# Patient Record
Sex: Female | Born: 1980 | Race: Black or African American | Hispanic: No | Marital: Single | Smoking: Never smoker
Health system: Southern US, Community
[De-identification: ages and names within clinical notes are randomized; demographics above are authoritative.]

## PROBLEM LIST (undated history)

## (undated) DIAGNOSIS — J310 Chronic rhinitis: Secondary | ICD-10-CM

## (undated) DIAGNOSIS — R7303 Prediabetes: Secondary | ICD-10-CM

## (undated) HISTORY — PX: TUBAL LIGATION: SHX77

## (undated) HISTORY — DX: Chronic rhinitis: J31.0

---

## 2018-08-05 ENCOUNTER — Other Ambulatory Visit: Payer: Medicaid Other

## 2018-08-05 ENCOUNTER — Observation Stay
Admission: EM | Admit: 2018-08-05 | Discharge: 2018-08-06 | Disposition: A | Discharge status: Home / Self Care | Payer: Medicaid Other | Attending: Internal Medicine | Admitting: Internal Medicine

## 2018-08-05 ENCOUNTER — Other Ambulatory Visit: Payer: Self-pay

## 2018-08-05 ENCOUNTER — Emergency Department: Payer: Medicaid Other

## 2018-08-05 ENCOUNTER — Observation Stay: Admit: 2018-08-05 | Payer: Medicaid Other

## 2018-08-05 DIAGNOSIS — R05 Cough: Secondary | ICD-10-CM | POA: Diagnosis present

## 2018-08-05 DIAGNOSIS — J9601 Acute respiratory failure with hypoxia: Principal | ICD-10-CM | POA: Insufficient documentation

## 2018-08-05 DIAGNOSIS — Z20828 Contact with and (suspected) exposure to other viral communicable diseases: Secondary | ICD-10-CM | POA: Diagnosis not present

## 2018-08-05 DIAGNOSIS — R0902 Hypoxemia: Secondary | ICD-10-CM | POA: Diagnosis present

## 2018-08-05 DIAGNOSIS — Z6841 Body Mass Index (BMI) 40.0 and over, adult: Secondary | ICD-10-CM | POA: Insufficient documentation

## 2018-08-05 DIAGNOSIS — R059 Cough, unspecified: Secondary | ICD-10-CM

## 2018-08-05 LAB — COMPREHENSIVE METABOLIC PANEL
ALT: 18 U/L (ref 0–44)
AST: 20 U/L (ref 15–41)
Albumin: 4 g/dL (ref 3.5–5.0)
Alkaline Phosphatase: 44 U/L (ref 38–126)
Anion gap: 9 (ref 5–15)
BUN: 11 mg/dL (ref 6–20)
CO2: 23 mmol/L (ref 22–32)
Calcium: 8.7 mg/dL — ABNORMAL LOW (ref 8.9–10.3)
Chloride: 108 mmol/L (ref 98–111)
Creatinine, Ser: 0.95 mg/dL (ref 0.44–1.00)
GFR calc Af Amer: >60 mL/min (ref >60)
GFR calc non Af Amer: >60 mL/min (ref >60)
Glucose, Bld: 108 mg/dL — ABNORMAL HIGH (ref 70–99)
Potassium: 3.5 mmol/L (ref 3.5–5.1)
Sodium: 140 mmol/L (ref 135–145)
TOTAL PROTEIN: 7.1 g/dL (ref 6.5–8.1)
Total Bilirubin: 0.6 mg/dL (ref 0.3–1.2)

## 2018-08-05 LAB — CBC WITH DIFFERENTIAL/PLATELET
Abs Immature Granulocytes: 0.04 10*3/uL (ref 0.00–0.07)
Basophils Absolute: 0 10*3/uL (ref 0.0–0.1)
Basophils Relative: 0 %
Eosinophils Absolute: 0.3 10*3/uL (ref 0.0–0.5)
Eosinophils Relative: 2 %
HEMATOCRIT: 33.5 % — AB (ref 36.0–46.0)
HEMOGLOBIN: 10.7 g/dL — AB (ref 12.0–15.0)
Immature Granulocytes: 0 %
LYMPHS PCT: 21 %
Lymphs Abs: 2.2 10*3/uL (ref 0.7–4.0)
MCH: 27.4 pg (ref 26.0–34.0)
MCHC: 31.9 g/dL (ref 30.0–36.0)
MCV: 85.7 fL (ref 80.0–100.0)
Monocytes Absolute: 0.6 10*3/uL (ref 0.1–1.0)
Monocytes Relative: 5 %
NEUTROS ABS: 7.6 10*3/uL (ref 1.7–7.7)
Neutrophils Relative %: 72 %
Platelets: 244 10*3/uL (ref 150–400)
RBC: 3.91 MIL/uL (ref 3.87–5.11)
RDW: 15.4 % (ref 11.5–15.5)
WBC: 10.7 10*3/uL — ABNORMAL HIGH (ref 4.0–10.5)
nRBC: 0 % (ref 0.0–0.2)

## 2018-08-05 LAB — CBC
HCT: 32.6 % — ABNORMAL LOW (ref 36.0–46.0)
Hemoglobin: 10.4 g/dL — ABNORMAL LOW (ref 12.0–15.0)
MCH: 27.4 pg (ref 26.0–34.0)
MCHC: 31.9 g/dL (ref 30.0–36.0)
MCV: 85.8 fL (ref 80.0–100.0)
Platelets: 221 10*3/uL (ref 150–400)
RBC: 3.8 MIL/uL — ABNORMAL LOW (ref 3.87–5.11)
RDW: 15.5 % (ref 11.5–15.5)
WBC: 9.1 10*3/uL (ref 4.0–10.5)
nRBC: 0 % (ref 0.0–0.2)

## 2018-08-05 LAB — LACTIC ACID, PLASMA
Lactic Acid, Venous: 1.2 mmol/L (ref 0.5–1.9)
Lactic Acid, Venous: 1 mmol/L (ref 0.5–1.9)

## 2018-08-05 LAB — INFLUENZA PANEL BY PCR (TYPE A & B)
Influenza A By PCR: NEGATIVE
Influenza B By PCR: NEGATIVE

## 2018-08-05 LAB — PROCALCITONIN: Procalcitonin: <0.1 ng/mL

## 2018-08-05 MED ORDER — ONDANSETRON HCL 4 MG/2ML IJ SOLN: 4.0000 mg | Freq: Four times a day (QID) | INTRAMUSCULAR | Status: DC | PRN

## 2018-08-05 MED ORDER — ENOXAPARIN SODIUM 40 MG/0.4ML ~~LOC~~ SOLN
40.0000 mg | Freq: Two times a day (BID) | SUBCUTANEOUS | Status: DC
  Administered 2018-08-05 – 2018-08-06 (×2): 40 mg via SUBCUTANEOUS
  Filled 2018-08-05 (×3): qty 0.4

## 2018-08-05 MED ORDER — SODIUM CHLORIDE 0.9 % IV BOLUS
1000.0000 mL | Freq: Once | INTRAVENOUS | Status: AC
  Administered 2018-08-05: 1000 mL via INTRAVENOUS

## 2018-08-05 MED ORDER — ALBUTEROL SULFATE HFA 108 (90 BASE) MCG/ACT IN AERS
1.0000 | INHALATION_SPRAY | Freq: Four times a day (QID) | RESPIRATORY_TRACT | Status: DC | PRN
  Filled 2018-08-05: qty 6.7

## 2018-08-05 MED ORDER — IOPAMIDOL (ISOVUE-370) INJECTION 76%
100.0000 mL | Freq: Once | INTRAVENOUS | Status: AC | PRN
  Administered 2018-08-05: 100 mL via INTRAVENOUS
  Filled 2018-08-05: qty 100

## 2018-08-05 MED ORDER — ACETAMINOPHEN 650 MG RE SUPP
650.0000 mg | Freq: Four times a day (QID) | RECTAL | Status: DC | PRN
  Filled 2018-08-05: qty 1

## 2018-08-05 MED ORDER — ACETAMINOPHEN 325 MG PO TABS
650.0000 mg | ORAL_TABLET | Freq: Four times a day (QID) | ORAL | Status: DC | PRN
  Filled 2018-08-05: qty 2

## 2018-08-05 MED ORDER — ONDANSETRON HCL 4 MG PO TABS
4.0000 mg | ORAL_TABLET | Freq: Four times a day (QID) | ORAL | Status: DC | PRN
  Filled 2018-08-05: qty 1

## 2018-08-05 MED ORDER — POLYETHYLENE GLYCOL 3350 17 G PO PACK
17.0000 g | PACK | Freq: Every day | ORAL | Status: DC | PRN
  Filled 2018-08-05: qty 1

## 2018-08-05 MED ORDER — IBUPROFEN 400 MG PO TABS: 600.0000 mg | ORAL_TABLET | Freq: Four times a day (QID) | ORAL | Status: DC | PRN

## 2018-08-05 MED ORDER — BUDESONIDE 0.25 MG/2ML IN SUSP: 0.2500 mg | Freq: Two times a day (BID) | RESPIRATORY_TRACT | Status: DC

## 2018-08-05 MED ORDER — ALBUTEROL SULFATE (2.5 MG/3ML) 0.083% IN NEBU: 2.5000 mg | INHALATION_SOLUTION | Freq: Four times a day (QID) | RESPIRATORY_TRACT | Status: DC | PRN

## 2018-08-05 MED ORDER — FLUTICASONE PROPIONATE HFA 110 MCG/ACT IN AERO
1.0000 | INHALATION_SPRAY | Freq: Two times a day (BID) | RESPIRATORY_TRACT | Status: DC
  Administered 2018-08-05 – 2018-08-06 (×2): 1 via RESPIRATORY_TRACT
  Filled 2018-08-05 (×2): qty 12

## 2018-08-05 MED ORDER — ALBUTEROL SULFATE HFA 108 (90 BASE) MCG/ACT IN AERS
2.0000 | INHALATION_SPRAY | RESPIRATORY_TRACT | Status: DC | PRN
  Filled 2018-08-05: qty 6.7

## 2018-08-05 NOTE — ED Notes (Signed)
Dr. Sudini at bedside.  

## 2018-08-05 NOTE — ED Notes (Signed)
ED TO INPATIENT HANDOFF REPORT  ED Nurse Name and Phone #: ally   S Name/Age/Gender Elizabeth Butler 38 y.o. female Room/Bed: ED34A/ED34A  Code Status   Code Status: Full Code  Home/SNF/Other Home Patient oriented to: self, place, time and situation Is this baseline? Yes   Triage Complete: Triage complete  Chief Complaint [facility] Diff Breathing  Triage Note Patient coming [facility] from home for difficulty breathing for 2 weeks. Patient c/o productive cough and medial chest pain.    Allergies No Known Allergies  Level of Care/Admitting Diagnosis ED Disposition    ED Disposition Condition Comment   Admit  Hospital Area: [facility] [[id number]]  Level of Care: Med-Surg [16]  Diagnosis: Hypoxia [300808]  Admitting Physician: Milagros Loll [[id number]]  Attending Physician: Milagros Loll [[id number]]  PT Class (Do Not Modify): Observation [104]  PT Acc Code (Do Not Modify): Observation [10022]       B Medical/Surgery History History reviewed. No pertinent past medical history. Past Surgical History:  Procedure Laterality Date  . TUBAL LIGATION       A IV Location/Drains/Wounds Patient Lines/Drains/Airways Status   Active Line/Drains/Airways    Name:   Placement date:   Placement time:   Site:   Days:   Peripheral IV 08/05/18 Left;Upper Arm   08/05/18    0430    Arm   less than 1          Intake/Output Last 24 hours No intake or output data in the 24 hours ending 08/05/18 1336  Labs/Imaging Results for orders placed or performed during the hospital encounter of 08/05/18 (from the past 48 hour(s))  Influenza panel by PCR (type A & B)     Status: None   Collection Time: 08/05/18  4:29 AM  Result Value Ref Range   Influenza A By PCR NEGATIVE NEGATIVE   Influenza B By PCR NEGATIVE NEGATIVE    Comment: (NOTE) The Xpert Xpress Flu assay is intended as an aid in the diagnosis of  influenza and should not be used as a sole basis for treatment.  This   assay is FDA approved for nasopharyngeal swab specimens only. Nasal  washings and aspirates are unacceptable for Xpert Xpress Flu testing. Performed at [facility], [address] [place], [place] [address]   CBC     Status: Abnormal   Collection Time: 08/05/18  4:29 AM  Result Value Ref Range   WBC 9.1 4.0 - 10.5 K/uL   RBC 3.80 (L) 3.87 - 5.11 MIL/uL   Hemoglobin 10.4 (L) 12.0 - 15.0 g/dL   HCT 08.6 (L) 57.8 - 46.9 %   MCV 85.8 80.0 - 100.0 fL   MCH 27.4 26.0 - 34.0 pg   MCHC 31.9 30.0 - 36.0 g/dL   RDW 62.9 52.8 - 41.3 %   Platelets 221 150 - 400 K/uL   nRBC 0.0 0.0 - 0.2 %    Comment: Performed at [facility], [address] [place], [place] [address]  Comprehensive metabolic panel     Status: Abnormal   Collection Time: 08/05/18  4:29 AM  Result Value Ref Range   Sodium 140 135 - 145 mmol/L   Potassium 3.5 3.5 - 5.1 mmol/L   Chloride 108 98 - 111 mmol/L   CO2 23 22 - 32 mmol/L   Glucose, Bld 108 (H) 70 - 99 mg/dL   BUN 11 6 - 20 mg/dL   Creatinine, Ser 0.27 0.44 - 1.00 mg/dL  Calcium 8.7 (L) 8.9 - 10.3 mg/dL   Total Protein 7.1 6.5 - 8.1 g/dL   Albumin 4.0 3.5 - 5.0 g/dL   AST 20 15 - 41 U/L   ALT 18 0 - 44 U/L   Alkaline Phosphatase 44 38 - 126 U/L   Total Bilirubin 0.6 0.3 - 1.2 mg/dL   GFR calc non Af Amer >60 >60 mL/min   GFR calc Af Amer >60 >60 mL/min   Anion gap 9 5 - 15    Comment: Performed at [facility], [address] [place], [place] [address]  Lactic acid, plasma     Status: None   Collection Time: 08/05/18  4:29 AM  Result Value Ref Range   Lactic Acid, Venous 1.2 0.5 - 1.9 mmol/L    Comment: Performed at [facility], [address] [place], [place] [address]   Dg Chest 1 View  Result Date: 08/05/2018 CLINICAL DATA:  Difficulty breathing for 2 weeks EXAM: CHEST  1 VIEW COMPARISON:  None. FINDINGS: Normal heart size and mediastinal contours. No acute infiltrate or edema. No effusion or pneumothorax.  No acute osseous findings. IMPRESSION: Negative portable chest. Electronically Signed   By: Marnee Spring M.D.   On: 08/05/2018 04:30   Ct Angio Chest Pe W And/or Wo Contrast  Result Date: 08/05/2018 CLINICAL DATA:  Shortness of breath and chest pain EXAM: CT ANGIOGRAPHY CHEST WITH CONTRAST TECHNIQUE: Multidetector CT imaging of the chest was performed using the standard protocol during bolus administration of intravenous contrast. Multiplanar CT image reconstructions and MIPs were obtained to evaluate the vascular anatomy. CONTRAST:  ISOVUE-370 IOPAMIDOL (ISOVUE-370) INJECTION 76% COMPARISON:  Chest radiograph August 05, 2018 FINDINGS: Cardiovascular: There is no demonstrable pulmonary embolus. There is no thoracic aortic aneurysm or dissection. Visualized great vessels appear normal. There is no appreciable pericardial effusion or pericardial thickening. Mediastinum/Nodes: Visualized thyroid appears unremarkable. There is no evident thoracic adenopathy by size criteria. Subcentimeter axillary lymph nodes are considered nonspecific. There is a focal hiatal hernia. Lungs/Pleura: There is no edema or consolidation. There is no appreciable pleural effusion or pleural thickening. Upper Abdomen: Visualized upper abdominal structures appear unremarkable. Musculoskeletal: There are no blastic or lytic bone lesions. No evident chest wall lesions. Review of the MIP images confirms the above findings. IMPRESSION: 1. No demonstrable pulmonary embolus. No thoracic aortic aneurysm or dissection. 2.  Lungs clear. 3. No appreciable thoracic adenopathy by size criteria. Scattered subcentimeter lymph nodes are considered nonspecific. 4.  Focal hiatal hernia present. Electronically Signed   By: Bretta Bang III M.D.   On: 08/05/2018 07:24    Pending Labs Unresulted Labs (From admission, onward)    Start     Ordered   08/12/18 0500  Creatinine, serum  (enoxaparin (LOVENOX)    CrCl >/= 30 ml/min)  Weekly,   STAT     Comments:  while on enoxaparin therapy    08/05/18 0814   08/06/18 0500  Basic metabolic panel  Tomorrow morning,   STAT     08/05/18 0814   08/06/18 0500  CBC  Tomorrow morning,   STAT     08/05/18 0814   08/05/18 1031  Procalcitonin - Baseline  ONCE - STAT,   STAT     08/05/18 1030   08/05/18 1031  CBC with Differential/Platelet  Add-on,   AD     08/05/18 1030   08/05/18 0814  HIV antibody (Routine Testing)  Add-on,   AD     08/05/18  4599   08/05/18 0528  Novel Coronavirus, NAA (hospital order; send-out to ref lab)  (Novel Coronavirus, NAA [facility] Order; send-out to ref lab) with precautions panel)  ONCE - STAT,   STAT    Question Answer Comment  Current symptoms Fever and Cough   Excluded other viral illnesses Yes   Exposure Risk None   Patient immune status Normal      08/05/18 0527   08/05/18 0417  Lactic acid, plasma  Now then every 2 hours,   STAT     08/05/18 0417          Vitals/Pain Today's Vitals   08/05/18 0820 08/05/18 0905 08/05/18 0948 08/05/18 1117  BP:    133/86  Pulse: 82 80 92 76  Resp: 20 15 18 18   Temp:      SpO2: 100%   100%  Weight:      Height:      PainSc:        Isolation Precautions Droplet and Contact precautions  Medications Medications  enoxaparin (LOVENOX) injection 40 mg (has no administration in time range)  acetaminophen (TYLENOL) tablet 650 mg (has no administration in time range)    Or  acetaminophen (TYLENOL) suppository 650 mg (has no administration in time range)  polyethylene glycol (MIRALAX / GLYCOLAX) packet 17 g (has no administration in time range)  ondansetron (ZOFRAN) tablet 4 mg (has no administration in time range)    Or  ondansetron (ZOFRAN) injection 4 mg (has no administration in time range)  ibuprofen (ADVIL,MOTRIN) tablet 600 mg (has no administration in time range)  albuterol (PROVENTIL HFA;VENTOLIN HFA) 108 (90 Base) MCG/ACT inhaler 2 puff (has no administration in time range)  fluticasone (FLOVENT  HFA) 110 MCG/ACT inhaler 1 puff (1 puff Inhalation Not Given 08/05/18 1308)  albuterol (PROVENTIL HFA;VENTOLIN HFA) 108 (90 Base) MCG/ACT inhaler 1 puff (has no administration in time range)  sodium chloride 0.9 % bolus 1,000 mL (0 mLs Intravenous Stopped 08/05/18 0802)  sodium chloride 0.9 % bolus 1,000 mL (0 mLs Intravenous Stopped 08/05/18 0802)  iopamidol (ISOVUE-370) 76 % injection 100 mL (100 mLs Intravenous Contrast Given 08/05/18 0658)    Mobility walks Low fall risk   Focused Assessments    R Recommendations: See Admitting Provider Note  Report given to:   Additional Notes:  Coronavirus pending

## 2018-08-05 NOTE — ED Notes (Signed)
ED Provider at bedside. 

## 2018-08-05 NOTE — ED Notes (Signed)
Report to 2A. Per Diplomatic Services operational officer, pt appropriate to come up at this time.

## 2018-08-05 NOTE — ED Notes (Signed)
Pt remains in CT at this time.  

## 2018-08-05 NOTE — ED Notes (Signed)
Patient ambulated on pulse oximetry per MD request. Patient's respiratory rate increased from 15 to 33. Patient's heart rate increased from 71 to 94. Patient's oxygen saturation level decreased from 100 to 89% on RA. Patient placed back in bed. RN will continue to monitor. MD Manson Passey informed.

## 2018-08-05 NOTE — ED Notes (Signed)
Pt ambulatory to toilet. Pt had informed CT that she was lightheaded so this RN accompanied pt to restroom. Pt was informed this RN had to stay with pt d/t feeling lightheaded. Pt did not want this RN to remain in bathroom with pt. Steady gait noted.

## 2018-08-05 NOTE — ED Triage Notes (Signed)
Patient coming [facility] from home for difficulty breathing for 2 weeks. Patient c/o productive cough and medial chest pain.

## 2018-08-05 NOTE — ED Notes (Signed)
Patient transported to CT 

## 2018-08-05 NOTE — Progress Notes (Addendum)
Patient admitted to 2A from ED.  Report given to this RN. Patient oriented to floor.  Patient in negative pressure room with droplet/contact precautions.

## 2018-08-05 NOTE — ED Notes (Signed)
Pharmacy talking to pt on the phone. Pt was asleep.

## 2018-08-05 NOTE — Consult Note (Addendum)
Pulmonary Medicine          Date: 08/05/2018,   MRN# [id number] Elizabeth Butler December 29, 1980     AdmissionWeight: 113.4 kg                 CurrentWeight: 113.4 kg      CHIEF COMPLAINT:   Chest discomfort, presyncope and intermittent cough from 6 weeks ago   HISTORY OF PRESENT ILLNESS   This is a pleasant 38 year old female she is a background history of morbid obesity, abnormal uterine bleeding, and seasonal allergies as well as intermittent cough with chest discomfort.  She reports that she had similar symptoms in February and review of records from [facility] indicates admission to the ED February 2 for chest pain as well as cough which was deemed as noninfectious and chest pain not to be costochondritis due to reproducible features.  She states that since February she has had intermittent cough which was nonproductive she also reports fevers however when measuring temperature T-max was 98-99.  She reports no travel or sick contacts recently.  She states that she has seasonal allergies with allergic rhinitis and itchy watery eyes around this time of year however denies personal history of asthma.  She currently denies shortness of breath and is able to exercise however feels chest tightness when exercising.  She denies constitutional symptoms denies nausea vomiting or diarrhea, denies falls.  She does endorse presyncope, dizziness and episodes of tachycardia throughout the last few months.   PAST MEDICAL HISTORY   History reviewed. No pertinent past medical history.   SURGICAL HISTORY   Past Surgical History:  Procedure Laterality Date  . TUBAL LIGATION       FAMILY HISTORY   No family history on file.   SOCIAL HISTORY   Social History   Tobacco Use  . Smoking status: Never Smoker  . Smokeless tobacco: Never Used  Substance Use Topics  . Alcohol use: Not Currently  . Drug use: Not on file     MEDICATIONS    Home Medication:    Current Medication:   Current Facility-Administered Medications:  .  acetaminophen (TYLENOL) tablet 650 mg, 650 mg, Oral, Q6H PRN **OR** acetaminophen (TYLENOL) suppository 650 mg, 650 mg, Rectal, Q6H PRN, Sudini, Srikar, MD .  albuterol (PROVENTIL HFA;VENTOLIN HFA) 108 (90 Base) MCG/ACT inhaler 2 puff, 2 puff, Inhalation, Q4H PRN, Sudini, Srikar, MD .  enoxaparin (LOVENOX) injection 40 mg, 40 mg, Subcutaneous, BID, Sudini, Srikar, MD .  ibuprofen (ADVIL,MOTRIN) tablet 600 mg, 600 mg, Oral, Q6H PRN, Sudini, Srikar, MD .  ondansetron (ZOFRAN) tablet 4 mg, 4 mg, Oral, Q6H PRN **OR** ondansetron (ZOFRAN) injection 4 mg, 4 mg, Intravenous, Q6H PRN, Sudini, Srikar, MD .  polyethylene glycol (MIRALAX / GLYCOLAX) packet 17 g, 17 g, Oral, Daily PRN, Milagros Loll, MD No current outpatient medications on file.    ALLERGIES   Patient has no known allergies.     REVIEW OF SYSTEMS    Review of Systems:  Gen:  Denies  fever, sweats, chills weigh loss  HEENT: Denies blurred vision, double vision, ear pain, eye pain, hearing loss, nose bleeds, sore throat Cardiac:  No dizziness, chest pain or heaviness, chest tightness,edema Resp: Reports intermittent cough Gi: Denies swallowing difficulty, stomach pain, nausea or vomiting, diarrhea, constipation, bowel incontinence Gu:  Denies bladder incontinence, burning urine Ext:   Denies Joint pain, stiffness or swelling Skin: Denies  skin rash, easy bruising or bleeding or hives Endoc:  Denies polyuria, polydipsia ,  polyphagia or weight change Psych:   Denies depression, insomnia or hallucinations   Other:  All other systems negative   VS: BP (!) 143/96   Pulse 92   Temp 98.9 F (37.2 C)   Resp 18   Ht  (1.6 m)   Wt 113.4 kg   SpO2 100%   BMI 44.29 kg/m      PHYSICAL EXAM    GENERAL:NAD, no fevers, chills, no weakness no fatigue HEAD: Normocephalic, atraumatic.  EYES: Pupils equal, round, reactive to light. Extraocular muscles intact. No scleral  icterus.  MOUTH: Moist mucosal membrane. Dentition intact. No abscess noted.  EAR, NOSE, THROAT: Clear without exudates. No external lesions.  NECK: Supple. No thyromegaly. No nodules. No JVD.  PULMONARY: Decreased breath sounds bilaterally without rhonchorous breath sounds, without wheezing and without crackles CARDIOVASCULAR: S1 and S2. Regular rate and rhythm. No murmurs, rubs, or gallops. No edema. Pedal pulses 2+ bilaterally.  GASTROINTESTINAL: Soft, nontender, nondistended. No masses. Positive bowel sounds. No hepatosplenomegaly.  MUSCULOSKELETAL: No swelling, clubbing, or edema. Range of motion full in all extremities.  NEUROLOGIC: Cranial nerves II through XII are intact. No gross focal neurological deficits. Sensation intact. Reflexes intact.  SKIN: No ulceration, lesions, rashes, or cyanosis. Skin warm and dry. Turgor intact.  PSYCHIATRIC: Mood, affect within normal limits. The patient is awake, alert and oriented x 3. Insight, judgment intact.       IMAGING    Dg Chest 1 View  Result Date: 08/05/2018 CLINICAL DATA:  Difficulty breathing for 2 weeks EXAM: CHEST  1 VIEW COMPARISON:  None. FINDINGS: Normal heart size and mediastinal contours. No acute infiltrate or edema. No effusion or pneumothorax. No acute osseous findings. IMPRESSION: Negative portable chest. Electronically Signed   By: Marnee Spring M.D.   On: 08/05/2018 04:30   Ct Angio Chest Pe W And/or Wo Contrast  Result Date: 08/05/2018 CLINICAL DATA:  Shortness of breath and chest pain EXAM: CT ANGIOGRAPHY CHEST WITH CONTRAST TECHNIQUE: Multidetector CT imaging of the chest was performed using the standard protocol during bolus administration of intravenous contrast. Multiplanar CT image reconstructions and MIPs were obtained to evaluate the vascular anatomy. CONTRAST:  ISOVUE-370 IOPAMIDOL (ISOVUE-370) INJECTION 76% COMPARISON:  Chest radiograph August 05, 2018 FINDINGS: Cardiovascular: There is no demonstrable  pulmonary embolus. There is no thoracic aortic aneurysm or dissection. Visualized great vessels appear normal. There is no appreciable pericardial effusion or pericardial thickening. Mediastinum/Nodes: Visualized thyroid appears unremarkable. There is no evident thoracic adenopathy by size criteria. Subcentimeter axillary lymph nodes are considered nonspecific. There is a focal hiatal hernia. Lungs/Pleura: There is no edema or consolidation. There is no appreciable pleural effusion or pleural thickening. Upper Abdomen: Visualized upper abdominal structures appear unremarkable. Musculoskeletal: There are no blastic or lytic bone lesions. No evident chest wall lesions. Review of the MIP images confirms the above findings. IMPRESSION: 1. No demonstrable pulmonary embolus. No thoracic aortic aneurysm or dissection. 2.  Lungs clear. 3. No appreciable thoracic adenopathy by size criteria. Scattered subcentimeter lymph nodes are considered nonspecific. 4.  Focal hiatal hernia present. Electronically Signed   By: Bretta Bang III M.D.   On: 08/05/2018 07:24         CT ch ASSESSMENT/PLAN   Chest discomfort, intermittent cough and presyncope     -Patient describes features suggestive of atopic history including seasonal allergies, intermittent cough, watery eyes with pruritus.  These may be more suggestive of a reactive airway component such as asthma which may be obesity  related asthma.  Ordering Pulmicort and albuterol nebulizer for now.    -Patient symptoms of chest discomfort and chest pain along with presyncope and dizziness may be suggestive of pulmonary hypertension and she may need a cardiac evaluation with right heart cath.  Noted transthoracic echo is pending. -lower suspicion is for novel coronavirus, I have DC'd respiratory viral panel due to lower suspicion for viral etiology -CBC with differential to evaluate for lymphopenia associated with novel coronavirus -Reviewed CT chest-per  radiologist no PE or cardiopulmonary process -Blood work essentially unremarkable     Thank you for allowing me to participate in the care of this patient.    Patient/Family are satisfied with care plan and all questions have been answered.  This document was prepared using Dragon voice recognition software and may include unintentional dictation errors.     Vida Rigger, M.D.  Division of Pulmonary & Critical Care Medicine  [facility]

## 2018-08-05 NOTE — ED Notes (Signed)
PT ambulatory to BR without difficulty.  States she was not dizzy when she stood this time.

## 2018-08-05 NOTE — ED Notes (Signed)
Dr. Aleskerov at bedside 

## 2018-08-05 NOTE — ED Notes (Signed)
ED TO INPATIENT HANDOFF REPORT  ED Nurse Name and Phone #:  Jae Dire and Victorino Dike [phone]  S Name/Age/Gender Elizabeth Butler 38 y.o. female Room/Bed: ED34A/ED34A  Code Status   Code Status: Full Code  Home/SNF/Other Home Patient oriented to: self Is this baseline? Yes   Triage Complete: Triage complete  Chief Complaint [facility] Diff Breathing  Triage Note Patient coming [facility] from home for difficulty breathing for 2 weeks. Patient c/o productive cough and medial chest pain.    Allergies No Known Allergies  Level of Care/Admitting Diagnosis ED Disposition    ED Disposition Condition Comment   Admit  Hospital Area: [facility] [[id number]]  Level of Care: Med-Surg [16]  Diagnosis: Hypoxia [300808]  Admitting Physician: Milagros Loll [[id number]]  Attending Physician: Milagros Loll [[id number]]  Estimated length of stay: past midnight tomorrow  Certification:: I certify this patient will need inpatient services for at least 2 midnights  PT Class (Do Not Modify): Inpatient [101]  PT Acc Code (Do Not Modify): Private [1]       B Medical/Surgery History History reviewed. No pertinent past medical history. Past Surgical History:  Procedure Laterality Date  . TUBAL LIGATION       A IV Location/Drains/Wounds Patient Lines/Drains/Airways Status   Active Line/Drains/Airways    Name:   Placement date:   Placement time:   Site:   Days:   Peripheral IV 08/05/18 Left;Upper Arm   08/05/18    0430    Arm   less than 1          Intake/Output Last 24 hours No intake or output data in the 24 hours ending 08/05/18 7341  Labs/Imaging Results for orders placed or performed during the hospital encounter of 08/05/18 (from the past 48 hour(s))  Influenza panel by PCR (type A & B)     Status: None   Collection Time: 08/05/18  4:29 AM  Result Value Ref Range   Influenza A By PCR NEGATIVE NEGATIVE   Influenza B By PCR NEGATIVE NEGATIVE    Comment: (NOTE) The Xpert Xpress  Flu assay is intended as an aid in the diagnosis of  influenza and should not be used as a sole basis for treatment.  This  assay is FDA approved for nasopharyngeal swab specimens only. Nasal  washings and aspirates are unacceptable for Xpert Xpress Flu testing. Performed at [facility], [address] [place], [place] [address]   CBC     Status: Abnormal   Collection Time: 08/05/18  4:29 AM  Result Value Ref Range   WBC 9.1 4.0 - 10.5 K/uL   RBC 3.80 (L) 3.87 - 5.11 MIL/uL   Hemoglobin 10.4 (L) 12.0 - 15.0 g/dL   HCT 24.0 (L) 97.3 - 53.2 %   MCV 85.8 80.0 - 100.0 fL   MCH 27.4 26.0 - 34.0 pg   MCHC 31.9 30.0 - 36.0 g/dL   RDW 99.2 42.6 - 83.4 %   Platelets 221 150 - 400 K/uL   nRBC 0.0 0.0 - 0.2 %    Comment: Performed at [facility], [address] [place], [place] [address]  Comprehensive metabolic panel     Status: Abnormal   Collection Time: 08/05/18  4:29 AM  Result Value Ref Range   Sodium 140 135 - 145 mmol/L   Potassium 3.5 3.5 - 5.1 mmol/L   Chloride 108 98 - 111 mmol/L   CO2 23 22 - 32 mmol/L   Glucose, Bld 108 (H) 70 -  99 mg/dL   BUN 11 6 - 20 mg/dL   Creatinine, Ser 1.32 0.44 - 1.00 mg/dL   Calcium 8.7 (L) 8.9 - 10.3 mg/dL   Total Protein 7.1 6.5 - 8.1 g/dL   Albumin 4.0 3.5 - 5.0 g/dL   AST 20 15 - 41 U/L   ALT 18 0 - 44 U/L   Alkaline Phosphatase 44 38 - 126 U/L   Total Bilirubin 0.6 0.3 - 1.2 mg/dL   GFR calc non Af Amer >60 >60 mL/min   GFR calc Af Amer >60 >60 mL/min   Anion gap 9 5 - 15    Comment: Performed at [facility], [address] [place], [place] [address]  Lactic acid, plasma     Status: None   Collection Time: 08/05/18  4:29 AM  Result Value Ref Range   Lactic Acid, Venous 1.2 0.5 - 1.9 mmol/L    Comment: Performed at [facility], [address] [place], [place] [address]   Dg Chest 1 View  Result Date: 08/05/2018 CLINICAL DATA:  Difficulty breathing for 2 weeks EXAM: CHEST  1 VIEW  COMPARISON:  None. FINDINGS: Normal heart size and mediastinal contours. No acute infiltrate or edema. No effusion or pneumothorax. No acute osseous findings. IMPRESSION: Negative portable chest. Electronically Signed   By: Marnee Spring M.D.   On: 08/05/2018 04:30   Ct Angio Chest Pe W And/or Wo Contrast  Result Date: 08/05/2018 CLINICAL DATA:  Shortness of breath and chest pain EXAM: CT ANGIOGRAPHY CHEST WITH CONTRAST TECHNIQUE: Multidetector CT imaging of the chest was performed using the standard protocol during bolus administration of intravenous contrast. Multiplanar CT image reconstructions and MIPs were obtained to evaluate the vascular anatomy. CONTRAST:  ISOVUE-370 IOPAMIDOL (ISOVUE-370) INJECTION 76% COMPARISON:  Chest radiograph August 05, 2018 FINDINGS: Cardiovascular: There is no demonstrable pulmonary embolus. There is no thoracic aortic aneurysm or dissection. Visualized great vessels appear normal. There is no appreciable pericardial effusion or pericardial thickening. Mediastinum/Nodes: Visualized thyroid appears unremarkable. There is no evident thoracic adenopathy by size criteria. Subcentimeter axillary lymph nodes are considered nonspecific. There is a focal hiatal hernia. Lungs/Pleura: There is no edema or consolidation. There is no appreciable pleural effusion or pleural thickening. Upper Abdomen: Visualized upper abdominal structures appear unremarkable. Musculoskeletal: There are no blastic or lytic bone lesions. No evident chest wall lesions. Review of the MIP images confirms the above findings. IMPRESSION: 1. No demonstrable pulmonary embolus. No thoracic aortic aneurysm or dissection. 2.  Lungs clear. 3. No appreciable thoracic adenopathy by size criteria. Scattered subcentimeter lymph nodes are considered nonspecific. 4.  Focal hiatal hernia present. Electronically Signed   By: Bretta Bang III M.D.   On: 08/05/2018 07:24    Pending Labs Unresulted Labs (From  admission, onward)    Start     Ordered   08/12/18 0500  Creatinine, serum  (enoxaparin (LOVENOX)    CrCl >/= 30 ml/min)  Weekly,   STAT    Comments:  while on enoxaparin therapy    08/05/18 0814   08/06/18 0500  Basic metabolic panel  Tomorrow morning,   STAT     08/05/18 0814   08/06/18 0500  CBC  Tomorrow morning,   STAT     08/05/18 0814   08/05/18 0814  HIV antibody (Routine Testing)  Add-on,   AD     08/05/18 6644   08/05/18 0347  Blood gas, venous  ONCE - STAT,   STAT  08/05/18 9169   08/05/18 0528  Novel Coronavirus, NAA (hospital order; send-out to ref lab)  (Novel Coronavirus, NAA [facility] Order; send-out to ref lab) with precautions panel)  ONCE - STAT,   STAT    Question Answer Comment  Current symptoms Fever and Cough   Excluded other viral illnesses Yes   Exposure Risk None   Patient immune status Normal      08/05/18 0527   08/05/18 0417  Lactic acid, plasma  Now then every 2 hours,   STAT     08/05/18 0417          Vitals/Pain Today's Vitals   08/05/18 0526 08/05/18 0600 08/05/18 0630 08/05/18 0820  BP: (!) 123/55 (!) 143/96    Pulse: 83 70 94 82  Resp: 18 16 (!) 33 20  Temp:      SpO2: 100% 100% (!) 89% 100%  Weight:      Height:      PainSc:        Isolation Precautions Droplet and Contact precautions  Medications Medications  enoxaparin (LOVENOX) injection 40 mg (has no administration in time range)  acetaminophen (TYLENOL) tablet 650 mg (has no administration in time range)    Or  acetaminophen (TYLENOL) suppository 650 mg (has no administration in time range)  polyethylene glycol (MIRALAX / GLYCOLAX) packet 17 g (has no administration in time range)  ondansetron (ZOFRAN) tablet 4 mg (has no administration in time range)    Or  ondansetron (ZOFRAN) injection 4 mg (has no administration in time range)  ibuprofen (ADVIL,MOTRIN) tablet 600 mg (has no administration in time range)  albuterol (PROVENTIL HFA;VENTOLIN HFA) 108 (90 Base) MCG/ACT  inhaler 2 puff (has no administration in time range)  sodium chloride 0.9 % bolus 1,000 mL (0 mLs Intravenous Stopped 08/05/18 0802)  sodium chloride 0.9 % bolus 1,000 mL (0 mLs Intravenous Stopped 08/05/18 0802)  iopamidol (ISOVUE-370) 76 % injection 100 mL (100 mLs Intravenous Contrast Given 08/05/18 0658)    Mobility walks Low fall risk   Focused Assessments    R Recommendations: See Admitting Provider Note  Report given to:   Additional Notes:

## 2018-08-05 NOTE — H&P (Signed)
[facility] - [place] at [facility]   PATIENT NAME: Elizabeth Butler    MR#:  [id number]  DATE OF BIRTH:  08/17/1980  DATE OF ADMISSION:  08/05/2018  PRIMARY CARE PHYSICIAN: Patient, No Pcp Per   REQUESTING/REFERRING PHYSICIAN: Dr. Manson Passey  CHIEF COMPLAINT:   Chief Complaint  Patient presents with  . Shortness of Breath    HISTORY OF PRESENT ILLNESS:  Elizabeth Butler  is a 38 y.o. female with no significant past medical history has had problem with allergies, cold, bronchitis since January.  Had mild temperature at that time.  Also had chest pain which was diagnosed with costochondritis on ED visit at [facility] and also follow-up with primary care physician.  Today she presents with cough that is not resolving along with pleuritic chest pain.  Chest x-ray and CT scan of the chest are normal.  COVID-19 test was ordered and patient placed in isolation with droplet and contact. Afebrile. Patient ambulated and saturations dropped to 87% on room air and patient is being admitted under observation.  Saturations normal at rest.  She did feel dizzy before arriving to the hospital and presently feels well.  PAST MEDICAL HISTORY:  History reviewed. No pertinent past medical history. Uterine bleeding PAST SURGICAL HISTORY:   Past Surgical History:  Procedure Laterality Date  . TUBAL LIGATION      SOCIAL HISTORY:   Social History   Tobacco Use  . Smoking status: Never Smoker  . Smokeless tobacco: Never Used  Substance Use Topics  . Alcohol use: Not Currently    FAMILY HISTORY:  No family history on file. No lung cancer DRUG ALLERGIES:  No Known Allergies  REVIEW OF SYSTEMS:   Review of Systems  Constitutional: Negative for chills and fever.  HENT: Negative for sore throat.   Eyes: Negative for blurred vision, double vision and pain.  Respiratory: Positive for cough. Negative for hemoptysis, shortness of breath and wheezing.   Cardiovascular: Positive for chest pain.  Negative for palpitations, orthopnea and leg swelling.  Gastrointestinal: Negative for abdominal pain, constipation, diarrhea, heartburn, nausea and vomiting.  Genitourinary: Negative for dysuria and hematuria.  Musculoskeletal: Negative for back pain and joint pain.  Skin: Negative for rash.  Neurological: Positive for dizziness. Negative for sensory change, speech change, focal weakness and headaches.  Endo/Heme/Allergies: Does not bruise/bleed easily.  Psychiatric/Behavioral: Negative for depression. The patient is not nervous/anxious.     MEDICATIONS AT HOME:   Prior to Admission medications   Medication Sig Start Date End Date Taking? Authorizing Provider  Multiple Vitamins-Minerals (MULTIPLE VITAMINS/WOMENS) tablet Take 1 tablet by mouth daily.   Yes [provider]     VITAL SIGNS:  Blood pressure 133/86, pulse 76, temperature 98.9 F (37.2 C), resp. rate 18, height 5\' 3"  (1.6 m), weight 113.4 kg, SpO2 100 %.  PHYSICAL EXAMINATION:  Physical Exam  GENERAL:  38 y.o.-year-old patient lying in the bed with no acute distress.  Orbitally obese EYES: Pupils equal, round, reactive to light and accommodation. No scleral icterus. Extraocular muscles intact.  HEENT: Head atraumatic, normocephalic. Oropharynx and nasopharynx clear. No oropharyngeal erythema, moist oral mucosa  NECK:  Supple, no jugular venous distention. No thyroid enlargement, no tenderness.  LUNGS: Normal breath sounds bilaterally, no wheezing, rales, rhonchi. No use of accessory muscles of respiration.  CARDIOVASCULAR: S1, S2 normal. No murmurs, rubs, or gallops.  ABDOMEN: Soft, nontender, nondistended. Bowel sounds present. No organomegaly or mass.  EXTREMITIES: No pedal edema, cyanosis, or clubbing. + 2 pedal &  radial pulses b/l.   NEUROLOGIC: Cranial nerves II through XII are intact. No focal Motor or sensory deficits appreciated b/l PSYCHIATRIC: The patient is alert and oriented x 3. Good affect.  SKIN:  No obvious rash, lesion, or ulcer.   LABORATORY PANEL:   CBC Recent Labs  Lab 08/05/18 0429  WBC 9.1  HGB 10.4*  HCT 32.6*  PLT 221   ------------------------------------------------------------------------------------------------------------------  Chemistries  Recent Labs  Lab 08/05/18 0429  NA 140  K 3.5  CL 108  CO2 23  GLUCOSE 108*  BUN 11  CREATININE 0.95  CALCIUM 8.7*  AST 20  ALT 18  ALKPHOS 44  BILITOT 0.6   ------------------------------------------------------------------------------------------------------------------  Cardiac Enzymes No results for input(s): TROPONINI in the last 168 hours. ------------------------------------------------------------------------------------------------------------------  RADIOLOGY:  Dg Chest 1 View  Result Date: 08/05/2018 CLINICAL DATA:  Difficulty breathing for 2 weeks EXAM: CHEST  1 VIEW COMPARISON:  None. FINDINGS: Normal heart size and mediastinal contours. No acute infiltrate or edema. No effusion or pneumothorax. No acute osseous findings. IMPRESSION: Negative portable chest. Electronically Signed   By: Jonathon  Watts M.D.   On: 08/05/2018 04:30   Ct Angio Chest Pe W And/or Wo Contrast  Result Date: 08/05/2018 CLINICAL DATA:  Shortness of breath and chest pain EXAM: CT ANGIOGRAPHY CHEST WITH CONTRAST TECHNIQUE: Multidetector CT imaging of the chest was performed using the standard protocol during bolus administration of intravenous contrast. Multiplanar CT image reconstructions and MIPs were obtained to evaluate the vascular anatomy. CONTRASagewest LanMarland [phone]pi[facility] [phone] [phone]piMar[facility] [phone]enMar[facility] [phone] [phone] [phone]<MEASUREMMarland Kitc[phone]pi[facility] [phone] HospiMarland [phone] Marland [phone] CenterMarland [phone]enMarland [phone] [phone]enMarland [phone] HospiMarland KitchentKentucMorrill County Community HospiMarland [phone] HospiMarland [phone] Marland [phone] HospiMarland [phone] CenMarland [phone] SightMarland [phone] CapeickMarland KitchenEPrincKentuckyess 161303KoMarland KitchenArDoristiMaisie DINGS: Cardiovascular: There is no demonstrable pulmonary embolus. There is no thoracic aortic aneurysm or dissection. Visualized great vessels appear normal. There is no appreciable pericardial effusion or pericardial thickening. Mediastinum/Nodes: Visualized thyroid appears  unremarkable. There is no evident thoracic adenopathy by size criteria. Subcentimeter axillary lymph nodes are considered nonspecific. There is a focal hiatal hernia. Lungs/Pleura: There is no edema or consolidation. There is no appreciable pleural effusion or pleural thickening. Upper Abdomen: Visualized upper abdominal structures appear unremarkable. Musculoskeletal: There are no blastic or lytic bone lesions. No evident chest wall lesions. Review of the MIP images confirms the above findings. IMPRESSION: 1. No demonstrable pulmonary embolus. No thoracic aortic aneurysm or dissection. 2.  Lungs clear. 3. No appreciable thoracic adenopathy by size criteria. Scattered subcentimeter lymph nodes are considered nonspecific. 4.  Focal hiatal hernia present. Electronically Signed   By: William  Woodruff III M.D.   On: 08/05/2018 07:24     IMPRESSION AND PLAN:   *Postinfectious reactive airway disease No wheezing at this time.  Chest pain seems to be main concern.  Ordered NSAIDs. Albuterol inhaler as needed Consulted pulmonary Dr. Aleskerov COVID-19 test ordered in ED is pending.  Seems unlikely as symptoms have been going on for many weeks. CT scan of the chest showed nothing acute.  *Hypoxia with ambulation.  Will need to repeat after her breathing treatments.  Will wait for pulmonary input.  Will need outpatient follow-up for sleep study and possible CPAP. Ordered echocardiogram  *Morbid obesity.  DVT prophylaxis with Lovenox   All the records are reviewed and case discussed with ED provider. Management plans discussed with the patient, family and they are in agreement.  CODE STATUS: Full code  TOTAL TIME TAKING CARE OF THIS PATIENT: 40 minutes.   Jamariya Davidoff R Coren Sagan M.D on 08/05/2018 at 12:30 PM  Between 7am to 6pm - Pager - [phone]  After 6pm go to [email or web address] -  password [email or web address] [facility]  [facility]  Office  [phone]  CC: Primary care physician; Patient, No Pcp  Per  Note: This dictation was prepared with Dragon dictation along with smaller phrase technology. Any transcriptional errors that result from this process are unintentional.

## 2018-08-05 NOTE — ED Provider Notes (Signed)
[facility] Emergency Department Provider Note    First MD Initiated Contact with Patient 08/05/18 0402     (approximate)  I have reviewed the triage vital signs and the nursing notes.   HISTORY  Chief Complaint Shortness of Breath    HPI Elizabeth Butler is a 38 y.o. female presents to the emergency department with progressive dyspnea x2 weeks with productive cough and medial chest discomfort.  Patient denies any fever afebrile on presentation.  Patient denies any travel and no known sick contact.   History reviewed. No pertinent past medical history.  There are no active problems to display for this patient.   Past Surgical History:  Procedure Laterality Date  . TUBAL LIGATION      Prior to Admission medications   Not on File    Allergies Patient has no known allergies.  No family history on file.  Social History Social History   Tobacco Use  . Smoking status: Never Smoker  . Smokeless tobacco: Never Used  Substance Use Topics  . Alcohol use: Not Currently  . Drug use: Not on file    Review of Systems Constitutional: Positive for chills Eyes: No visual changes. ENT: No sore throat. Cardiovascular: Denies chest pain. Respiratory: Positive for dyspnea and cough Gastrointestinal: No abdominal pain.  No nausea, no vomiting.  No diarrhea.  No constipation. Genitourinary: Negative for dysuria. Musculoskeletal: Negative for neck pain.  Negative for back pain. Integumentary: Negative for rash. Neurological: Negative for headaches, focal weakness or numbness.   ____________________________________________   PHYSICAL EXAM:  VITAL SIGNS: ED Triage Vitals  Enc Vitals Group     BP 08/05/18 0402 117/82     Pulse Rate 08/05/18 0402 82     Resp 08/05/18 0402 19     Temp 08/05/18 0402 98.9 F (37.2 C)     Temp src --      SpO2 08/05/18 0402 100 %     Weight 08/05/18 0404 113.4 kg (250 lb)     Height 08/05/18 0404 1.6 m (5\' 3" )   Head Circumference --      Peak Flow --      Pain Score 08/05/18 0403 7     Pain Loc --      Pain Edu? --      Excl. in GC? --     Constitutional: Alert and oriented.  Ill-appearing Eyes: Conjunctivae are normal.  Head: Atraumatic. Mouth/Throat: Mucous membranes are moist.  Oropharynx non-erythematous. Neck: No stridor.  Cardiovascular: Normal rate, regular rhythm. Good peripheral circulation. Grossly normal heart sounds. Respiratory: Tachypnea.  No retractions. Lungs CTAB. Gastrointestinal: Soft and nontender. No distention.  Musculoskeletal: No lower extremity tenderness nor edema. No gross deformities of extremities. Neurologic:  Normal speech and language. No gross focal neurologic deficits are appreciated.  Skin:  Skin is warm, dry and intact. No rash noted. Psychiatric: Mood and affect are normal. Speech and behavior are normal.  ____________________________________________   LABS (all labs ordered are listed, but only abnormal results are displayed)  Labs Reviewed  CBC - Abnormal; Notable for the following components:      Result Value   RBC 3.80 (*)    Hemoglobin 10.4 (*)    HCT 32.6 (*)    All other components within normal limits  COMPREHENSIVE METABOLIC PANEL - Abnormal; Notable for the following components:   Glucose, Bld 108 (*)    Calcium 8.7 (*)    All other components within normal limits  NOVEL CORONAVIRUS, NAA (  HOSPITAL ORDER, SEND-OUT TO REF LAB)  INFLUENZA PANEL BY PCR (TYPE A & B)  LACTIC ACID, PLASMA  LACTIC ACID, PLASMA  BLOOD GAS, VENOUS   ____________________________________________  EKG  ED ECG REPORT I, Drexel N Jeron Grahn, the attending physician, personally viewed and interpreted this ECG.   Date: 08/05/2018  EKG Time: 4:10 AM  Rate: 85  Rhythm: Normal sinus rhythm  Axis: Normal  Intervals: Normal  ST&T Change: None   RADIOLOGY I, [place] N Doyle Tegethoff, personally viewed and evaluated these images (plain radiographs) as part of my  medical decision making, as well as reviewing the written report by the radiologist.  ED MD interpretation: Negative portable chest x-ray per radiologist.  Official radiology report(s): Dg Chest 1 View  Result Date: 08/05/2018 CLINICAL DATA:  Difficulty breathing for 2 weeks EXAM: CHEST  1 VIEW COMPARISON:  None. FINDINGS: Normal heart size and mediastinal contours. No acute infiltrate or edema. No effusion or pneumothorax. No acute osseous findings. IMPRESSION: Negative portable chest. Electronically Signed   By: Marnee Spring M.D.   On: 08/05/2018 04:30    ___________________________________  .Critical Care Performed by: Darci Current, MD Authorized by: Darci Current, MD   Critical care provider statement:    Critical care time (minutes):  30   Critical care time was exclusive of:  Separately billable procedures and treating other patients   Critical care was necessary to treat or prevent imminent or life-threatening deterioration of the following conditions:  Respiratory failure   Critical care was time spent personally by me on the following activities:  Development of treatment plan with patient or surrogate, discussions with consultants, evaluation of patient's response to treatment, examination of patient, obtaining history from patient or surrogate, ordering and performing treatments and interventions, ordering and review of laboratory studies, ordering and review of radiographic studies, pulse oximetry, re-evaluation of patient's condition and review of old charts     ____________________________________________   INITIAL IMPRESSION / MDM / ASSESSMENT AND PLAN / ED COURSE  As part of my medical decision making, I reviewed the following data within the electronic MEDICAL RECORD NUMBER   38 year old female presented with above-stated history and physical exam secondary to dyspnea cough and chest discomfort.  Patient ill-appearing despite the fact laboratory data is  reassuring.  As such ambulatory sat was performed patient's oxygen saturation dropped to 88/89% with a respiratory rate increasing from 16-33.  CT angiogram of the chest performed to evaluate for possible pulmonary emboli however none was noted.  Unclear etiology for the patient's hypoxic respiratory failure and as such COVID-19 testing performed ____________________________________________  FINAL CLINICAL IMPRESSION(S) / ED DIAGNOSES  Final diagnoses:  Acute respiratory failure with hypoxia (HCC)     MEDICATIONS GIVEN DURING THIS VISIT:  Medications  sodium chloride 0.9 % bolus 1,000 mL (1,000 mLs Intravenous New Bag/Given 08/05/18 0440)  sodium chloride 0.9 % bolus 1,000 mL (1,000 mLs Intravenous New Bag/Given 08/05/18 0457)  iopamidol (ISOVUE-370) 76 % injection 100 mL (100 mLs Intravenous Contrast Given 08/05/18 5009)     ED Discharge Orders    None       Note:  This document was prepared using Dragon voice recognition software and may include unintentional dictation errors.   Darci Current, MD 08/05/18 [phone]

## 2018-08-06 LAB — BASIC METABOLIC PANEL
Anion gap: 7 (ref 5–15)
BUN: 8 mg/dL (ref 6–20)
CO2: 24 mmol/L (ref 22–32)
Calcium: 8.4 mg/dL — ABNORMAL LOW (ref 8.9–10.3)
Chloride: 107 mmol/L (ref 98–111)
Creatinine, Ser: 0.95 mg/dL (ref 0.44–1.00)
GFR calc Af Amer: >60 mL/min (ref >60)
GFR calc non Af Amer: >60 mL/min (ref >60)
Glucose, Bld: 118 mg/dL — ABNORMAL HIGH (ref 70–99)
POTASSIUM: 3.2 mmol/L — AB (ref 3.5–5.1)
Sodium: 138 mmol/L (ref 135–145)

## 2018-08-06 LAB — CBC
HCT: 33.7 % — ABNORMAL LOW (ref 36.0–46.0)
Hemoglobin: 10.6 g/dL — ABNORMAL LOW (ref 12.0–15.0)
MCH: 26.9 pg (ref 26.0–34.0)
MCHC: 31.5 g/dL (ref 30.0–36.0)
MCV: 85.5 fL (ref 80.0–100.0)
NRBC: 0 % (ref 0.0–0.2)
Platelets: 235 10*3/uL (ref 150–400)
RBC: 3.94 MIL/uL (ref 3.87–5.11)
RDW: 15.4 % (ref 11.5–15.5)
WBC: 8.6 10*3/uL (ref 4.0–10.5)

## 2018-08-06 LAB — HIV ANTIBODY (ROUTINE TESTING W REFLEX): HIV Screen 4th Generation wRfx: NONREACTIVE

## 2018-08-06 LAB — MAGNESIUM: Magnesium: 2.1 mg/dL (ref 1.7–2.4)

## 2018-08-06 MED ORDER — ALBUTEROL SULFATE HFA 108 (90 BASE) MCG/ACT IN AERS: 2.0000 | INHALATION_SPRAY | RESPIRATORY_TRACT | 0 refills | Status: AC | PRN

## 2018-08-06 MED ORDER — POTASSIUM CHLORIDE CRYS ER 20 MEQ PO TBCR
40.0000 meq | EXTENDED_RELEASE_TABLET | Freq: Once | ORAL | Status: AC
  Administered 2018-08-06: 40 meq via ORAL
  Filled 2018-08-06: qty 2

## 2018-08-06 NOTE — Discharge Summary (Signed)
[facility] [place] [facility]   PATIENT NAME: Elizabeth Butler    MR#:  [id number]  DATE OF BIRTH:  Oct 02, 1980  DATE OF ADMISSION:  08/05/2018 ADMITTING PHYSICIAN: Milagros Loll, MD  DATE OF DISCHARGE: 08/06/2018  PRIMARY CARE PHYSICIAN: Lyn Records, MD    ADMISSION DIAGNOSIS:  Cough [R05] Acute respiratory failure with hypoxia (HCC) [J96.01] Hypoxia [R09.02]  DISCHARGE DIAGNOSIS:  Acute hypoxic respiratory failure  SECONDARY DIAGNOSIS:  History reviewed. No pertinent past medical history.  HOSPITAL COURSE:  38 year old female with morbid obesity and seasonal allergies who presented to the emergency room due to intermittent cough or chest discomfort.  1.  Acute hypoxic respiratory failure: Patient was eval by pulmonary. She was fairly quickly weaned off of oxygen.. Pulmonologist felt patient may have reactive airway component such as asthma.  Patient will be discharged on oral inhalers.  She will need outpatient pulmonary follow-up. As per pulmonary there is no suspicion for coronavirus, however she had COVID-19 testing. Respiratory panel was discontinued by pulmonologist as well. CT chest was negative for PE  2.  Chest discomfort: This has all improved.  Initially echocardiogram was ordered however cardiologist felt that she did not need this.  3.  Morbid obesity: Patient encouraged to lose weight as tolerated.  DISCHARGE CONDITIONS AND DIET:   Stable Regular diet  CONSULTS OBTAINED:  Treatment Team:  Vida Rigger, MD  DRUG ALLERGIES:  No Known Allergies  DISCHARGE MEDICATIONS:   Allergies as of 08/06/2018   No Known Allergies     Medication List    TAKE these medications   albuterol 108 (90 Base) MCG/ACT inhaler Commonly known as:  PROVENTIL HFA;VENTOLIN HFA Inhale 2 puffs into the lungs every 4 (four) hours as needed for wheezing or shortness of breath.   Multiple Vitamins/Womens tablet Take 1 tablet by mouth daily.          Today   CHIEF COMPLAINT:   patient doing well this am no issues    VITAL SIGNS:  Blood pressure 130/78, pulse 80, temperature 98.1 F (36.7 C), temperature source Oral, resp. rate 19, height  (1.6 m), weight 114.3 kg, SpO2 98 %.   REVIEW OF SYSTEMS:  Review of Systems  Constitutional: Negative.  Negative for chills, fever and malaise/fatigue.  HENT: Negative.  Negative for ear discharge, ear pain, hearing loss, nosebleeds and sore throat.   Eyes: Negative.  Negative for blurred vision and pain.  Respiratory: Negative.  Negative for cough, hemoptysis, shortness of breath and wheezing.   Cardiovascular: Negative.  Negative for chest pain, palpitations and leg swelling.  Gastrointestinal: Negative.  Negative for abdominal pain, blood in stool, diarrhea, nausea and vomiting.  Genitourinary: Negative.  Negative for dysuria.  Musculoskeletal: Negative.  Negative for back pain.  Skin: Negative.   Neurological: Negative for dizziness, tremors, speech change, focal weakness, seizures and headaches.  Endo/Heme/Allergies: Negative.  Does not bruise/bleed easily.  Psychiatric/Behavioral: Negative.  Negative for depression, hallucinations and suicidal ideas.     PHYSICAL EXAMINATION:  GENERAL:  37 y.o.-year-old patient lying in the bed with no acute distress.  NECK:  Supple, no jugular venous distention. No thyroid enlargement, no tenderness.  LUNGS: Normal breath sounds bilaterally, no wheezing, rales,rhonchi  No use of accessory muscles of respiration.  CARDIOVASCULAR: S1, S2 normal. No murmurs, rubs, or gallops.  ABDOMEN: Soft, non-tender, non-distended. Bowel sounds present. No organomegaly or mass.  EXTREMITIES: No pedal edema, cyanosis, or clubbing.  PSYCHIATRIC: The patient is alert and oriented x 3.  SKIN: No obvious rash, lesion, or ulcer.   DATA REVIEW:   CBC Recent Labs  Lab 08/06/18 0652  WBC 8.6  HGB 10.6*  HCT 33.7*  PLT 235    Chemistries  Recent  Labs  Lab 08/05/18 0429 08/06/18 0652  NA 140 138  K 3.5 3.2*  CL 108 107  CO2 23 24  GLUCOSE 108* 118*  BUN 11 8  CREATININE 0.95 0.95  CALCIUM 8.7* 8.4*  MG  --  2.1  AST 20  --   ALT 18  --   ALKPHOS 44  --   BILITOT 0.6  --     Cardiac Enzymes No results for input(s): TROPONINI in the last 168 hours.  Microbiology Results  @[email or web address] @  RADIOLOGY:  Dg Chest 1 View  Result Date: 08/05/2018 CLINICAL DATA:  Difficulty breathing for 2 weeks EXAM: CHEST  1 VIEW COMPARISON:  None. FINDINGS: Normal heart size and mediastinal contours. No acute infiltrate or edema. No effusion or pneumothorax. No acute osseous findings. IMPRESSION: Negative portable chest. Electronically Signed   By: Marnee Spring M.D.   On: 08/05/2018 04:30   Ct Angio Chest Pe W And/or Wo Contrast  Result Date: 08/05/2018 CLINICAL DATA:  Shortness of breath and chest pain EXAM: CT ANGIOGRAPHY CHEST WITH CONTRAST TECHNIQUE: Multidetector CT imaging of the chest was performed using the standard protocol during bolus administration of intravenous contrast. Multiplanar CT image reconstructions and MIPs were obtained to evaluate the vascular anatomy. CONTRAST:  ISOVUE-370 IOPAMIDOL (ISOVUE-370) INJECTION 76% COMPARISON:  Chest radiograph August 05, 2018 FINDINGS: Cardiovascular: There is no demonstrable pulmonary embolus. There is no thoracic aortic aneurysm or dissection. Visualized great vessels appear normal. There is no appreciable pericardial effusion or pericardial thickening. Mediastinum/Nodes: Visualized thyroid appears unremarkable. There is no evident thoracic adenopathy by size criteria. Subcentimeter axillary lymph nodes are considered nonspecific. There is a focal hiatal hernia. Lungs/Pleura: There is no edema or consolidation. There is no appreciable pleural effusion or pleural thickening. Upper Abdomen: Visualized upper abdominal structures appear unremarkable. Musculoskeletal: There are no blastic or  lytic bone lesions. No evident chest wall lesions. Review of the MIP images confirms the above findings. IMPRESSION: 1. No demonstrable pulmonary embolus. No thoracic aortic aneurysm or dissection. 2.  Lungs clear. 3. No appreciable thoracic adenopathy by size criteria. Scattered subcentimeter lymph nodes are considered nonspecific. 4.  Focal hiatal hernia present. Electronically Signed   By: Bretta Bang III M.D.   On: 08/05/2018 07:24      Allergies as of 08/06/2018   No Known Allergies     Medication List    TAKE these medications   albuterol 108 (90 Base) MCG/ACT inhaler Commonly known as:  PROVENTIL HFA;VENTOLIN HFA Inhale 2 puffs into the lungs every 4 (four) hours as needed for wheezing or shortness of breath.   Multiple Vitamins/Womens tablet Take 1 tablet by mouth daily.          Management plans discussed with the patient and she is in agreement. Stable for discharge home  Patient should follow up with pcp  CODE STATUS:     Code Status Orders  (From admission, onward)         Start     Ordered   08/05/18 0813  Full code  Continuous     08/05/18 0814        Code Status History    This patient has a current code status but no historical code status.  TOTAL TIME TAKING CARE OF THIS PATIENT: 38 minutes.    Note: This dictation was prepared with Dragon dictation along with smaller phrase technology. Any transcriptional errors that result from this process are unintentional.  Adrian Saran M.D on 08/06/2018 at 10:56 AM  Between 7am to 6pm - Pager - [phone] After 6pm go to [email or web address] - password [email or web address]  [facility]  Office  [phone]  CC: Primary care physician; Lyn Records, MD

## 2018-08-06 NOTE — Progress Notes (Signed)
Discharge instructions and med details reviewed with patient. Pt verbalizes understanding. Patient provided with information booklet on Covid 19. Patient verbalized understanding. Printed AVS and prescription given to pt. Pt provided with a mask and escorted out via wheelchair.

## 2018-08-06 NOTE — Progress Notes (Signed)
Pt. reports swelling in her neck lymph nodes.  Pt. states that initially, they felt the size of a small pea and now they feel like they are getting bigger.  Pt. states that she is noticing her throat is sore when she swallows. Pt. questions if this is in relation to  a dental issue she is having currently.   Does not indicate difficulty with swallowing. Denies pain at this time. Will continue to monitor.

## 2018-08-08 LAB — NOVEL CORONAVIRUS, NAA (HOSP ORDER, SEND-OUT TO REF LAB; TAT 18-24 HRS): SARS-CoV-2, NAA: NOT DETECTED

## 2018-08-10 ENCOUNTER — Telehealth: Payer: Self-pay | Admitting: Emergency Medicine

## 2018-08-10 ENCOUNTER — Telehealth: Payer: Medicaid Other | Admitting: Nurse Practitioner

## 2018-08-10 DIAGNOSIS — J4520 Mild intermittent asthma, uncomplicated: Secondary | ICD-10-CM | POA: Diagnosis not present

## 2018-08-10 DIAGNOSIS — J4521 Mild intermittent asthma with (acute) exacerbation: Secondary | ICD-10-CM

## 2018-08-10 DIAGNOSIS — J45909 Unspecified asthma, uncomplicated: Secondary | ICD-10-CM | POA: Insufficient documentation

## 2018-08-10 MED ORDER — PREDNISONE 20 MG PO TABS: ORAL_TABLET | ORAL | 0 refills | Status: DC

## 2018-08-10 NOTE — Progress Notes (Signed)
E Visit for Asthma  Based on what you have shared with me, it looks like you may have a flare up of your asthma.  Asthma is a chronic (ongoing) lung disease which results in airway obstruction, inflammation and hyper-responsiveness.   Asthma symptoms vary from person to person, with common symptoms including nighttime awakening and decreased ability to participate in normal activities as a result of shortness of breath. It is often triggered by changes in weather, changes in the season, changes in air temperature, or inside (home, school, daycare or work) allergens such as animal dander, mold, mildew, woodstoves or cockroaches.   It can also be triggered by hormonal changes, extreme emotion, physical exertion or an upper respiratory tract illness.     It is important to identify the trigger, and then eliminate or avoid the trigger if possible.   If you have been prescribed medications to be taken on a regular basis, it is important to follow the asthma action plan and to follow guidelines to adjust medication in response to increasing symptoms of decreased peak expiratory flow rate  Treatment: I have prescribed: Prednisone 40mg by mouth per day for 5 - 7 days  HOME CARE . Only take medications as instructed by your medical team. . Consider wearing a mask or scarf to improve breathing air temperature have been shown to decrease irritation and decrease exacerbations . Get rest. . Taking a steamy shower or using a humidifier may help nasal congestion sand ease sore throat pain. You can place a towel over your head and breathe in the steam from hot water coming from a faucet. . Using a saline nasal spray works much the same way.  . Cough drops, hare candies and sore throat lozenges may ease your cough.  . Avoid close contacts especially the very you and the elderly . Cover your mouth if  you cough or sneeze . Always remember to wash your hands.    GET HELP RIGHT AWAY IF: . You develop worsening symptoms; breathlessness at rest, drowsy, confused or agitated, unable to speak in full sentences . You have coughing fits . You develop a severe headache or visual changes . You develop shortness of breath, difficulty breathing or start having chest pain . Your symptoms persist after you have completed your treatment plan . If your symptoms do not improve within 10 days  MAKE SURE YOU . Understand these instructions. . Will watch your condition. . Will get help right away if you are not doing well or get worse.   Your e-visit answers were reviewed by a board certified advanced clinical practitioner to complete your personal care plan, Depending upon the condition, your plan could have included both over the counter or prescription medications.  Please review your pharmacy choice. Your safety is important to us. If you have drug allergies check your prescription carefully. You can use MyChart to ask questions about today's visit, request a non-urgent call back, or ask for a work or school excuse for 24 hours related to this e-Visit. If it has been greater than 24 hours you will need to follow up with your provider, or enter a new e-Visit to address those concerns.  You will get an e-mail in the next two days asking about your experience. I hope that your e-visit has been valuable and will speed your recovery. Thank you for using e-visits.  5 minutes spent reviewing and documenting in chart.  

## 2018-08-10 NOTE — Telephone Encounter (Signed)
Called to assure patient has seen novel corona virus result in mychart.  She says she has seen negative result.

## 2018-08-17 ENCOUNTER — Ambulatory Visit (INDEPENDENT_AMBULATORY_CARE_PROVIDER_SITE_OTHER): Payer: Medicaid Other | Admitting: Internal Medicine

## 2018-08-17 ENCOUNTER — Encounter: Payer: Self-pay | Admitting: Internal Medicine

## 2018-08-17 DIAGNOSIS — J309 Allergic rhinitis, unspecified: Secondary | ICD-10-CM

## 2018-08-17 MED ORDER — CETIRIZINE HCL 10 MG PO TABS: 10.0000 mg | ORAL_TABLET | Freq: Every day | ORAL | 6 refills | Status: DC

## 2018-08-17 NOTE — Progress Notes (Signed)
VIDEO/TELEPHONE VISIT    In the setting of the current Covid19 crisis, you are scheduled for a  visit with me on 08/17/2018  Just as we do with many in-office visits, in order for you to participate in this visit, we must obtain consent.   I can obtain your verbal consent now.  PATIENT AGREES AND CONFIRMS -YES This Visit has Audio and Visual Capabilities for optimal patient care experience   Evaluation Performed:  CONSULT  This visit type was conducted due to national recommendations for restrictions regarding the COVID-19 Pandemic (e.g. social distancing).  This format is felt to be most appropriate for this patient at this time.  All issues noted in this document were discussed and addressed.  No physical exam was performed (except for noted visual exam findings with Telehealth visits).  See MyChart message from today for the patient's consent to telehealth for [facility]   Virtual Visit via Telephone Note   I connected with patient on 08/17/2018  by video and verified that I am speaking with the correct person using two identifiers.   I discussed the limitations, risks, security and privacy concerns of performing an evaluation and management service by telephone and the availability of in person appointments. I also discussed with the patient that there may be a patient responsible charge related to this service. The patient expressed understanding and agreed to proceed.   Location of the patient: Home Location of provider: Home Participating persons: Patient and provider only    Date:  08/17/2018   ID:  Elizabeth Butler, DOB May 21, 1980, MRN [id number]  Patient Location:  [address] [place] [address]   Provider location:   [facility]  PCP:  Lyn Records, MD   Chief Complaint:  I had asthma   attack    History of Present Illness:    Elizabeth Butler is a 38 y.o. female who presents via audio/video conferencing for a telehealth visit  today.   The patient does not symptoms concerning for COVID-19 infection (fever, chills, cough, or new SHORTNESS OF BREATH).   Patient assessed today for asthma attack that happened several weeks ago Patient been diagnosed with flu in February 1 month later patient woke up in the middle of night with intermittent chest pain with cough and could not breathe Patient states that the air was dry and she was exposed to dust Patient was evaluated in the ER several weeks ago and was tested for COVID which was negative  Since then patient was discharged on Flovent albuterol Singulair prednisone and albuterol nebulizers  Upon further questioning patient has no clue why she was given all these medicines Patient has not been using any of these in the past several weeks  At this time patient has no signs of infection No signs of shortness of breath No evidence of respiratory distress  Patient does have history of allergic rhinitis Associated with itchy eyes runny nose sneezing Patient has been exposed to pollen perfumes dry air and dust which exacerbates her symptoms of allergic rhinitis   Growing up as a child patient has not had any any type of asthma-like symptoms There is no significant of asthma history in her family Patient denies eczema   Patient has not been taking her prednisone She has only been taking Flovent which is working half the time  She still has symptoms of allergic rhinitis Triggers include dust pollen dry air perfumes   Prior RADIOLOGY STUDIES  The  following studies were reviewed today:08/17/2018 Chest x-ray 08/05/2018 No acute finding No pneumonia No effusions Normal chest x-ray  CT chest 08/05/2018 No evidence of pneumonia No evidence of interstitial lung disease Normal CT chest  I have Independently reviewed images of  on 08/17/2018 Interpretation: Normal findings no acute process    No past medical history on file. Past Surgical History:  Procedure  Laterality Date  . TUBAL LIGATION       No outpatient medications have been marked as taking for the 08/17/18 encounter (Appointment) with Erin FullingKasa, Enyla Lisbon, MD.     Allergies:   Patient has no known allergies.   Social History   Tobacco Use  . Smoking status: Never Smoker  . Smokeless tobacco: Never Used  Substance Use Topics  . Alcohol use: Not Currently  . Drug use: Not on file  No pets at home Works as a Conservation officer, naturecashier at [employer]  Family Hx: +HTN  Review of Systems:  Gen:  Denies  fever, sweats, chills weigh loss  HEENT: Runny nose itchy eyes Cardiac:  No dizziness, chest pain or heaviness, chest tightness,edema, No JVD Resp:   No cough, -sputum production, -shortness of breath,-wheezing, -hemoptysis,  Gi: Denies swallowing difficulty, stomach pain, nausea or vomiting, diarrhea, constipation, bowel incontinence Gu:  Denies bladder incontinence, burning urine Ext:   Denies Joint pain, stiffness or swelling Skin: Denies  skin rash, easy bruising or bleeding or hives Endoc:  Denies polyuria, polydipsia , polyphagia or weight change Psych:   Denies depression, insomnia or hallucinations  Other:  All other systems negative   Vital Signs:  There were no vitals taken for this visit.   Physical Examination:  Well nourished, well developed female in no acute distress. No Obvious Respiratory Distress noted EOMI intact, NO obvious oral lesions Facial Skin intact-no facial rash noted CN 3-12 intact   Insight, judgment intact. -depression -anxiety     Labs/Other Tests and Data Reviewed:    Recent Labs: 08/05/2018: ALT 18 08/06/2018: BUN 8; Creatinine, Ser 0.95; Hemoglobin 10.6; Magnesium 2.1; Platelets 235; Potassium 3.2; Sodium 138     ASSESSMENT & PLAN:    38 year old pleasant African-American female seen today for assessment for previous admission for probable asthma attack however in the setting of allergic rhinitis patient has experienced acute reactive  airways disease in the setting of dust exposure.  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  The importance of social distancing was discussed today.  Patient Risk:   After full review of this patients clinical status, I feel that they are at least moderate risk at this time.  Time:   Today, I have spent  62 minutes with the patient with telehealth technology discussing .     Medication Adjustments/Labs and Tests Ordered: Current medicines are reviewed at length with the patient today.  Concerns regarding medicines are outlined above.      Disposition: Follow-up in 1 month   Patient satisfied with Plan of action and management. All questions answered     Lucie LeatherKurian David Jeslin Bazinet, M.D.  [facility]  Medical Director [place] Director [facility] Cardio-Pulmonary Department

## 2018-08-17 NOTE — Patient Instructions (Signed)
Recommend Zyrtec 10 mg at bedtime daily Avoid dust,Dry air, perfumes, pollen  Recommend using albuterol 2 to 4 puffs every 4 hours as needed  I have recommended stopping all other inhalers and medications that were previously prescribed upon discharge   I have recommended that she put her pillowcases and bed sheets in the dryer on high heat to kill any dust mites

## 2018-09-04 ENCOUNTER — Telehealth: Payer: Medicaid Other | Admitting: Physician Assistant

## 2018-09-04 DIAGNOSIS — J069 Acute upper respiratory infection, unspecified: Secondary | ICD-10-CM | POA: Diagnosis not present

## 2018-09-04 MED ORDER — IPRATROPIUM BROMIDE 0.03 % NA SOLN: 2.0000 | Freq: Three times a day (TID) | NASAL | 12 refills | Status: AC

## 2018-09-04 NOTE — Progress Notes (Signed)
We are sorry you are not feeling well.  Here is how we plan to help!  Based on what you have shared with me, it looks like you may have a viral upper respiratory infection.  Upper respiratory infections are caused by a large number of viruses; however, rhinovirus is the most common cause.   Symptoms vary from person to person, with common symptoms including sore throat, cough, and fatigue or lack of energy.  A low-grade fever of up to 100.4 may present, but is often uncommon.  Symptoms vary however, and are closely related to a person's age or underlying illnesses.  The most common symptoms associated with an upper respiratory infection are nasal discharge or congestion, cough, sneezing, headache and pressure in the ears and face.  These symptoms usually persist for about 3 to 10 days, but can last up to 2 weeks.  It is important to know that upper respiratory infections do not cause serious illness or complications in most cases.    Upper respiratory infections can be transmitted from person to person, with the most common method of transmission being a person's hands.  The virus is able to live on the skin and can infect other persons for up to 2 hours after direct contact.  Also, these can be transmitted when someone coughs or sneezes; thus, it is important to cover the mouth to reduce this risk.  To keep the spread of the illness at bay, good hand hygiene is very important.  This is an infection that is most likely caused by a virus. There are no specific treatments other than to help you with the symptoms until the infection runs its course.  We are sorry you are not feeling well.  Here is how we plan to help!   For nasal congestion, you may use an oral decongestants such as Mucinex D or if you have glaucoma or high blood pressure use plain Mucinex.  Saline nasal spray or nasal drops can help and can safely be used as often as needed for congestion.  For your congestion, I have prescribed Ipratropium  Bromide nasal spray 0.03% two sprays in each nostril 2-3 times a day  If you do not have a history of heart disease, hypertension, diabetes or thyroid disease, prostate/bladder issues or glaucoma, you may also use Sudafed to treat nasal congestion.  It is highly recommended that you consult with a pharmacist or your primary care physician to ensure this medication is safe for you to take.     If you have a cough, you may use cough suppressants such as Delsym and Robitussin.  If you have glaucoma or high blood pressure, you can also use Coricidin HBP.   If you have a sore or scratchy throat, use a saltwater gargle-  to  teaspoon of salt dissolved in a 4-ounce to 8-ounce glass of warm water.  Gargle the solution for approximately 15-30 seconds and then spit.  It is important not to swallow the solution.  You can also use throat lozenges/cough drops and Chloraseptic spray to help with throat pain or discomfort.  Warm or cold liquids can also be helpful in relieving throat pain.  For headache, pain or general discomfort, you can use Ibuprofen or Tylenol as directed.   Some authorities believe that zinc sprays or the use of Echinacea may shorten the course of your symptoms.   HOME CARE Only take medications as instructed by your medical team. Be sure to drink plenty of fluids. Water is  fine as well as fruit juices, sodas and electrolyte beverages. You may want to stay away from caffeine or alcohol. If you are nauseated, try taking small sips of liquids. How do you know if you are getting enough fluid? Your urine should be a pale yellow or almost colorless. Get rest. Taking a steamy shower or using a humidifier may help nasal congestion and ease sore throat pain. You can place a towel over your head and breathe in the steam from hot water coming from a faucet. Using a saline nasal spray works much the same way. Cough drops, hard candies and sore throat lozenges may ease your cough. Avoid close  contacts especially the very young and the elderly Cover your mouth if you cough or sneeze Always remember to wash your hands.   GET HELP RIGHT AWAY IF: You develop worsening fever. If your symptoms do not improve within 10 days You develop yellow or green discharge from your nose over 3 days. You have coughing fits You develop a severe head ache or visual changes. You develop shortness of breath, difficulty breathing or start having chest pain  Your symptoms persist after you have completed your treatment plan  MAKE SURE YOU  Understand these instructions. Will watch your condition. Will get help right away if you are not doing well or get worse.  Your e-visit answers were reviewed by a board certified advanced clinical practitioner to complete your personal care plan. Depending upon the condition, your plan could have included both over the counter or prescription medications. Please review your pharmacy choice. If there is a problem, you may call our nursing hot line at and have the prescription routed to another pharmacy. Your safety is important to [place]. If you have drug allergies check your prescription carefully.   You can use MyChart to ask questions about today's visit, request a non-urgent call back, or ask for a work or school excuse for 24 hours related to this e-Visit. If it has been greater than 24 hours you will need to follow up with your provider, or enter a new e-Visit to address those concerns. You will get an e-mail in the next two days asking about your experience.  I hope that your e-visit has been valuable and will speed your recovery. Thank you for using e-visits.      ===View-only below this line===   ----- Message -----    From: Saundra Shelling    Sent: 09/04/2018  1:29 PM EDT      To: E-Visit Mailing List Subject: E-Visit Submission: Upper Respiratory Infection  E-Visit Submission: Upper Respiratory Infection --------------------------------  Question:  Which of the following are you experiencing? Answer:   Congested nose            Sneezing            Cough  Question: How long have you been having these symptoms? Answer:   4 days  Question: How long have you been coughing? Answer:   4 days  Question: How would you describe the cough? Answer:   A cough that is part of a cold  Question: Does the cough prevent you from sleeping at night? Answer:   No  Question: How often are you coughing? Answer:   Infrequently but steadily  Question: Do you have a fever? Answer:   No, I do not have a fever  Question: Are you in close contact with anyone who has similar symptoms ? Answer:   Yes  Question: Are  you treated for any of the following conditions: Asthma, COPD, Diabetes, Renal Failure (on Dialysis), AIDS, any Neuromuscular disease that effects the clearing of secretions, Heart Failure, or Heart Disease? Answer:   No  Question: Are you taking any over the counter medications for your symptoms? Answer:   Yes  Question: Please list the name(s) of the over the counter medications and whether they are helping or not: Answer:   Vaporub- slightly            Theraflu- this powder medication really helped me            Flonase nasal spray- helps temporarily             Zyrtec- helps but gives me headaches  Question: Please list your medication allergies that you may have ? (If 'none' , please list as 'none') Answer:   None  Question: Please list any additional comments  Answer:     Question: Are you pregnant? Answer:   I am confident that I am not pregnant  Question: Are you breastfeeding? Answer:   No  A total of 5-10 minutes was spent evaluating this patients questionnaire and formulating a plan of care.

## 2018-09-06 ENCOUNTER — Telehealth: Payer: Self-pay | Admitting: Internal Medicine

## 2018-09-06 ENCOUNTER — Other Ambulatory Visit: Payer: Self-pay | Admitting: Student

## 2018-09-06 DIAGNOSIS — R110 Nausea: Secondary | ICD-10-CM

## 2018-09-06 DIAGNOSIS — R1013 Epigastric pain: Secondary | ICD-10-CM

## 2018-09-06 NOTE — Telephone Encounter (Signed)
Called and spoke to pt. Pt states that she was prescribed Zyrtec for allergic rhinitis, however Zyrtec caused a headache daily. Pt stopped taking zyrtec one week ago and headaches subsided. Pt feels that an allergy med is needed as she has an occ lingering cough after developing a cold recently. Pt would like a medication that she can take during the day that does not cause drowsiness.   DK please advise. Thanks

## 2018-09-07 ENCOUNTER — Other Ambulatory Visit: Payer: Self-pay

## 2018-09-07 ENCOUNTER — Ambulatory Visit
Admission: RE | Admit: 2018-09-07 | Discharge: 2018-09-07 | Disposition: A | Discharge status: Home / Self Care | Payer: Medicaid Other | Source: Ambulatory Visit | Attending: Student | Admitting: Student

## 2018-09-07 DIAGNOSIS — R1013 Epigastric pain: Secondary | ICD-10-CM | POA: Insufficient documentation

## 2018-09-07 DIAGNOSIS — R110 Nausea: Secondary | ICD-10-CM | POA: Insufficient documentation

## 2018-09-12 MED ORDER — LORATADINE 10 MG PO TABS: 10.0000 mg | ORAL_TABLET | Freq: Every day | ORAL | 2 refills | Status: DC

## 2018-09-12 NOTE — Telephone Encounter (Signed)
LMTCB

## 2018-09-12 NOTE — Telephone Encounter (Signed)
Lets try Claritan 10 mg daily

## 2018-09-12 NOTE — Telephone Encounter (Signed)
Pt states that she was prescribed Zyrtec for allergic rhinitis, however Zyrtec caused a headache daily. Pt stopped taking zyrtec one week ago and headaches subsided. Pt feels that an allergy med is needed as she has an occ lingering cough after developing a cold recently. Pt would like a medication that she can take during the day that does not cause drowsiness.   Dr. Belia Heman, please advise. Thanks

## 2018-09-12 NOTE — Telephone Encounter (Signed)
Spoke to patient, will send in Claritin 10 mg to [facility] in [place].

## 2018-09-13 ENCOUNTER — Ambulatory Visit
Admission: RE | Admit: 2018-09-13 | Discharge: 2018-09-13 | Disposition: A | Discharge status: Home / Self Care | Payer: Medicaid Other | Source: Ambulatory Visit | Attending: Student | Admitting: Student

## 2018-09-13 ENCOUNTER — Other Ambulatory Visit: Payer: Self-pay

## 2018-09-13 DIAGNOSIS — R1013 Epigastric pain: Secondary | ICD-10-CM | POA: Diagnosis present

## 2018-09-13 DIAGNOSIS — R110 Nausea: Secondary | ICD-10-CM | POA: Insufficient documentation

## 2018-09-13 MED ORDER — TECHNETIUM TC 99M MEBROFENIN IV KIT
5.0430 | PACK | Freq: Once | INTRAVENOUS | Status: AC | PRN
  Administered 2018-09-13: 5.043 via INTRAVENOUS

## 2018-09-16 ENCOUNTER — Encounter: Payer: Self-pay | Admitting: Internal Medicine

## 2018-09-16 ENCOUNTER — Ambulatory Visit (INDEPENDENT_AMBULATORY_CARE_PROVIDER_SITE_OTHER): Payer: Medicaid Other | Admitting: Internal Medicine

## 2018-09-16 ENCOUNTER — Other Ambulatory Visit: Payer: Self-pay

## 2018-09-16 VITALS — BP 124/76 | HR 82 | Ht 63.0 in | Wt 237.0 lb

## 2018-09-16 DIAGNOSIS — R079 Chest pain, unspecified: Secondary | ICD-10-CM

## 2018-09-16 DIAGNOSIS — G4719 Other hypersomnia: Secondary | ICD-10-CM | POA: Diagnosis not present

## 2018-09-16 NOTE — Progress Notes (Signed)
Date:  38/12/2018   ID:  Elizabeth ShellingKelly Vanengen, DOB Jan 13, 1981, MRN [id number]  Patient Location:  [address] [place] [id number]   [facility]  PCP:  Lyn RecordsSabapathi, Anuradha, MD   SYNOPSIS Follow up asthma attack that happened several weeks ago Patient been diagnosed with flu in February Patient has been exposed to pollen perfumes dry air and dust which exacerbates her symptoms of allergic rhinitis  Chief Complaint: follow up cough  HPI Patient follow up for reactive airways disease Her cough and mucus production has resolved She does not use inhalers Her allergies have cleared up HVAC system and home recently cleaned by professional  Only complaint now is chest pain on and off for last several days Happens at rest, sometimes with exertion    Also  problems and issues with sleep related to excessive daytime sleepiness Patient  has been having sleep problems for many years Patient has been having excessive daytime sleepiness for a long time Patient has been having extreme fatigue and tiredness, lack of energy +  very Loud snoring every night + struggling breathe at night and gasps for air   Discussed sleep data and reviewed with patient.  Encouraged proper weight management.  Discussed driving precautions and its relationship with hypersomnolence.  Discussed operating dangerous equipment and its relationship with hypersomnolence.  Discussed sleep hygiene, and benefits of a fixed sleep waked time.  The importance of getting eight or more hours of sleep discussed with patient.  Discussed limiting the use of the computer and television before bedtime.  Decrease naps during the day, so night time sleep will become enhanced.  Limit caffeine, and sleep deprivation.  HTN, stroke, and heart failure are potential risk factors.    EPWORTH SLEEP SCORE 13   Prior RADIOLOGY STUDIES  The following studies were reviewed  today:38/12/2018 Chest x-ray 08/05/2018 No acute finding No pneumonia No effusions Normal chest x-ray  CT chest 08/05/2018 No evidence of pneumonia No evidence of interstitial lung disease Normal CT chest  I have Independently reviewed images of  on 38/12/2018 Interpretation: Normal findings no acute process    No past medical history on file. Past Surgical History:  Procedure Laterality Date  . TUBAL LIGATION       Current Meds  Medication Sig  . albuterol (PROVENTIL HFA;VENTOLIN HFA) 108 (90 Base) MCG/ACT inhaler Inhale 2 puffs into the lungs every 4 (four) hours as needed for wheezing or shortness of breath.  Marland Kitchen. ipratropium (ATROVENT) 0.03 % nasal spray Place 2 sprays into both nostrils 3 (three) times daily.  . Multiple Vitamins-Minerals (MULTIPLE VITAMINS/WOMENS) tablet Take 1 tablet by mouth daily.  . [DISCONTINUED] cetirizine (ZYRTEC ALLERGY) 10 MG tablet Take 1 tablet (10 mg total) by mouth daily.  . [DISCONTINUED] predniSONE (DELTASONE) 20 MG tablet 2 po at sametime daily for 5 days     Allergies:   Patient has no known allergies.   Social History   Tobacco Use  . Smoking status: Never Smoker  . Smokeless tobacco: Never Used  Substance Use Topics  . Alcohol use: Not Currently  . Drug use: Not on file  No pets at home Works as a Conservation officer, naturecashier at [employer]  Family Hx: +HTN    Review of Systems:  Gen:  Denies  fever, sweats, chills weigh loss  HEENT: Denies blurred vision, double vision, ear pain, eye pain, hearing loss, nose bleeds, sore throat Cardiac: +chest pain or +  heaviness, -edema, No JVD Resp:   No cough, -sputum production, -shortness of breath,-wheezing, -hemoptysis,  Gi: Denies swallowing difficulty, stomach pain, nausea or vomiting, diarrhea, constipation, bowel incontinence Gu:  Denies bladder incontinence, burning urine Ext:   Denies Joint pain, stiffness or swelling Skin: Denies  skin rash, easy bruising or bleeding or  hives Endoc:  Denies polyuria, polydipsia , polyphagia or weight change Psych:   Denies depression, insomnia or hallucinations  Other:  All other systems negative    Vital Signs:  BP 124/76 (BP Location: Left Arm, Cuff Size: Normal)   Pulse 82   Ht 5\' 3"  (1.6 m)   Wt 237 lb (107.5 kg)   SpO2 99%   BMI 41.98 kg/m    Physical Examination:   GENERAL:NAD, no fevers, chills, no weakness no fatigue HEAD: Normocephalic, atraumatic.  EYES: PERLA, EOMI No scleral icterus.  MOUTH: Moist mucosal membrane.  EAR, NOSE, THROAT: Clear without exudates. No external lesions.  NECK: Supple. No thyromegaly.  No JVD.  PULMONARY: CTA B/L no wheezing, rhonchi, crackles CARDIOVASCULAR: S1 and S2. Regular rate and rhythm. No murmurs GASTROINTESTINAL: Soft, nontender, nondistended. Positive bowel sounds.  MUSCULOSKELETAL: No swelling, clubbing, or edema.  NEUROLOGIC: No gross focal neurological deficits. 5/5 strength all extremities SKIN: No ulceration, lesions, rashes, or cyanosis.  PSYCHIATRIC: Insight, judgment intact. -depression -anxiety ALL OTHER ROS ARE NEGATIVE         Labs/Other Tests and Data Reviewed:    Recent Labs: 08/05/2018: ALT 18 08/06/2018: BUN 8; Creatinine, Ser 0.95; Hemoglobin 10.6; Magnesium 2.1; Platelets 235; Potassium 3.2; Sodium 138     ASSESSMENT & PLAN:    38 yo pleasant AAF follow up for acute reactive airways disease that have resolved with time and cleaning her HVAC system, now has complaints of intermittent chest pain and also signs and symptoms of excessive daytime sleepiness and snoring c/w probable underlying OSA associated with obesity and deconditioned state  1.Chest pain-patient needs cardiology evaluation  2.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  The importance of social distancing was discussed today.  MEDICATION ADJUSTMENTS/LABS AND TESTS ORDERED:  CURRENT MEDICATIONS REVIEWED AT LENGTH WITH PATIENT TODAY   Patient  satisfied with Plan of action and management. All questions answered  Follow up 3 months  Kennia Vanvorst Santiago Glad, M.D.  [facility]  Medical Director [facility] Medical Director [facility] Cardio-Pulmonary Department

## 2018-09-16 NOTE — Patient Instructions (Signed)
Patient will need Cardiology referral for chest pain  Patient will need Sleep Study to assess for sleep apnea

## 2018-10-11 ENCOUNTER — Ambulatory Visit: Payer: Medicaid Other | Attending: Internal Medicine

## 2018-10-11 DIAGNOSIS — G4733 Obstructive sleep apnea (adult) (pediatric): Secondary | ICD-10-CM | POA: Insufficient documentation

## 2018-10-12 ENCOUNTER — Other Ambulatory Visit: Payer: Self-pay

## 2018-10-12 DIAGNOSIS — R0683 Snoring: Secondary | ICD-10-CM

## 2018-10-13 ENCOUNTER — Telehealth: Payer: Self-pay | Admitting: Internal Medicine

## 2018-10-13 NOTE — Telephone Encounter (Signed)
Sleep study performed on 10/11/2018 did not show evidence of OSA and mild snoring. AHI 0.2. Recommends follow up with referring physician, education on sleep hygiene and clinic correlation with meds and conditions which could cause daytime sleepiness.  Pt is aware of results and voiced her understanding. Pt has pending OV for 12/21/2018. Nothing further is needed.

## 2018-10-21 ENCOUNTER — Telehealth: Payer: Self-pay | Admitting: Internal Medicine

## 2018-10-21 DIAGNOSIS — G4719 Other hypersomnia: Secondary | ICD-10-CM

## 2018-10-21 NOTE — Telephone Encounter (Signed)
Pt wants to know if she can get another sleep study done because the last one came back normal but she has an oxygen reader at home and she thinks she had an apnea episode las tnight and she snores a little. Wants to see about getting a mouth guard, if not CPAP machine.

## 2018-10-21 NOTE — Telephone Encounter (Signed)
Called and spoke to pt and relayed below recommendations.  Pt would like to proceed with referral or oral guard.  Order has been placed. Nothing further is needed.

## 2018-10-21 NOTE — Telephone Encounter (Signed)
DK please advise. Thanks 

## 2018-10-21 NOTE — Telephone Encounter (Signed)
If insurance allows,  another sleep study  Can be ordered If insurance does NOT cover, then patient can pay out of pocket She can be referred to [facility] for oral guard if sleep study not obtained

## 2018-10-26 ENCOUNTER — Telehealth: Payer: Self-pay | Admitting: Internal Medicine

## 2018-10-26 NOTE — Telephone Encounter (Signed)
Per [employer] will not cover an Oral Appliance to treat OSA.  Only option pt's insurance would pay for would be CPAP.  Please advise. Rhonda J Cobb

## 2018-10-27 NOTE — Telephone Encounter (Signed)
Spoke with patient and advised patient that the oral appliance is not covered under pt's insurance per [facility].  Dr. Mortimer Fries requested phone visit with patient to discuss. Patient contacted and phone visit appointment has been scheduled for 11/14/2018 at 11:45. Rhonda J Cobb

## 2018-10-27 NOTE — Telephone Encounter (Signed)
Please make follow up telephone visit to discuss further

## 2018-10-28 ENCOUNTER — Encounter: Payer: Self-pay | Admitting: Internal Medicine

## 2018-10-28 ENCOUNTER — Ambulatory Visit (INDEPENDENT_AMBULATORY_CARE_PROVIDER_SITE_OTHER): Payer: Medicaid Other | Admitting: Internal Medicine

## 2018-10-28 DIAGNOSIS — J4520 Mild intermittent asthma, uncomplicated: Secondary | ICD-10-CM

## 2018-10-28 DIAGNOSIS — R51 Headache: Secondary | ICD-10-CM

## 2018-10-28 DIAGNOSIS — R519 Headache, unspecified: Secondary | ICD-10-CM

## 2018-10-28 NOTE — Patient Instructions (Signed)
NEUROLGY CONSULTATION FOR HEADACHES  CONTINUE NASAL SPRAYS AS PRESCRIBED  AVOID TRIGGERS AVOID ALLERGENS

## 2018-10-28 NOTE — Progress Notes (Signed)
Date:  10/28/2018   ID:  Elizabeth Butler, DOB [birth year], MRN [id number]  Patient Location:  [place]  [id number]   Provider location:   [place]  PCP:  Elizabeth Picket, MD        VIDEO/TELEPHONE VISIT    In the setting of the current Covid19 crisis, you are scheduled for a  visit with me on 10/28/2018  Just as we do with many in-office visits, in order for you to participate in this visit, we must obtain consent.   I can obtain your verbal consent now.  PATIENT AGREES AND CONFIRMS -YES This Visit has Audio and Visual Capabilities for optimal patient care experience   Evaluation Performed:  Follow-up visit  This visit type was conducted due to national recommendations for restrictions regarding the COVID-19 Pandemic (e.g. social distancing).  This format is felt to be most appropriate for this patient at this time.  All issues noted in this document were discussed and addressed.     Virtual Visit via Telephone Note    I connected with patient on 10/28/2018  by telephone and verified that I am speaking with the correct person using two identifiers.   I discussed the limitations, risks, security and privacy concerns of performing an evaluation and management service by telephone and the availability of in person appointments. I also discussed with the patient that there may be a patient responsible charge related to this service. The patient expressed understanding and agreed to proceed.   Location of the patient: Home Location of provider: office Participating persons: Patient and provider only  SYNOPSIS Follow up asthma attack that happened several weeks ago Patient been diagnosed with flu in February Patient has been exposed to pollen perfumes dry air and dust which exacerbates her symptoms of allergic rhinitis  Chief Complaint:  Follow up COUGH   HPI Follow-up reactive airways disease and asthma To be controlled at  this time Albuterol as needed infrequent use  No signs of infection at this time No signs of asthma exacerbation  +sinus congestion Uses nasal sprays Uses atrovent nasal sprays help a little On claritian     Patient does NOT have sleep apnea October 11, 2018 sleep study confirms snoring 441 times but no evidence of sleep apnea Lowest oxygen level was 93%  Patient still with morning headaches Sometimes through the day     Prior RADIOLOGY STUDIES  The following studies were reviewed today:10/28/2018 Chest x-ray 08/05/2018 No acute finding No pneumonia No effusions Normal chest x-ray  CT chest 08/05/2018 No evidence of pneumonia No evidence of interstitial lung disease Normal CT chest  I have Independently reviewed images of  on 10/28/2018 Interpretation: Normal findings no acute process    No past medical history on file. Past Surgical History:  Procedure Laterality Date  . TUBAL LIGATION       No outpatient medications have been marked as taking for the 10/28/18 encounter (Appointment) with Elizabeth Lipps, MD.     Allergies:   Patient has no known allergies.   Social History   Tobacco Use  . Smoking status: Never Smoker  . Smokeless tobacco: Never Used  Substance Use Topics  . Alcohol use: Not Currently  . 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  Family Hx: +HTN    Review of Systems:  Gen:  Denies  fever, sweats, chills weigh loss  HEENT: +  sinus congestion +HA Cardiac:  No dizziness, chest pain or heaviness, chest tightness,edema, No JVD Resp:   No cough, -sputum production, -shortness of breath,-wheezing, -hemoptysis,  Gi: Denies swallowing difficulty, stomach pain, nausea or vomiting, diarrhea, constipation, bowel incontinence Gu:  Denies bladder incontinence, burning urine Ext:   Denies Joint pain, stiffness or swelling Skin: Denies  skin rash, easy bruising or bleeding or hives Endoc:  Denies  polyuria, polydipsia , polyphagia or weight change Psych:   Denies depression, insomnia or hallucinations  Other:  All other systems negative             Labs/Other Tests and Data Reviewed:    Recent Labs: 08/05/2018: ALT 18 08/06/2018: BUN 8; Creatinine, Ser 0.95; Hemoglobin 10.6; Magnesium 2.1; Platelets 235; Potassium 3.2; Sodium 138     ASSESSMENT & PLAN:    38-year-old pleasant female follow-up for acute reactive airways disease which have resolved with time and cleaning of her HVAC system now has complaints of sinus congestion allergic rhinitis and intermittent wheezing with underlying obesity and deconditioned state  Patient does not have any evidence of sleep apnea according to her sleep study Patient does have snoring episodes Patient does have sinus congestion and headaches on a daily basis   Intermittent reactive airways disease with allergic rhinitis Albuterol as needed Claritin as needed Avoid dust and pollen  Headaches Patient will need neurology consultation   Allergic rhinitis with sinus congestion Continue nasal sprays as prescribed Continue over-the-counter antihistamine May consider ENT evaluation at some point  Obesity -recommend significant weight loss -recommend changing diet  Deconditioned state -Recommend increased daily activity and exercise    COVID-19 EDUCATION: The signs and symptoms of COVID-19 were discussed with the patient and how to seek care for testing (follow up with PCP or arrange E-visit).  The importance of social distancing was discussed today.  MEDICATION ADJUSTMENTS/LABS AND TESTS ORDERED:  NEUROLOGY CONSULTATION FOR HEADACHES Continue albuterol Continue nasal sprays  CURRENT MEDICATIONS REVIEWED AT LENGTH WITH PATIENT TODAY   Patient  satisfied with Plan of action and management. All questions answered Follow in 6 months  Time spent 23 minutes   Elizabeth Butler Elizabeth Butler, M.D.  [facility]  Medical Director [facility] Medical Director [facility] Cardio-P[facility] Department

## 2018-10-28 NOTE — Addendum Note (Signed)
Addended by: Maryanna Shape A on: 10/28/2018 10:48 AM   Modules accepted: Orders

## 2018-10-31 ENCOUNTER — Telehealth: Payer: Self-pay | Admitting: Internal Medicine

## 2018-10-31 NOTE — Telephone Encounter (Signed)
Information sent to patient's primary care physician at [facility] in [place].  Pt is aware that primary care will have to arrange appointment. Nothing else needed at this time.  Phone note sent to DK as FYI. Elizabeth Butler

## 2018-10-31 NOTE — Telephone Encounter (Signed)
Called and spoke with [facility] Neurology Dept. With patient's insurance Neurology is unable to accept the referral from another specialist. Referral must be made through primary care physician.  Called and spoke with patient and she is aware that primary care physician Dr. Claudette Head at [facility] in [place] will have to refer patient.  30 phone number is [phone].   Our referral has been canceled since we are unable to schedule for patient. Rhonda J Cobb

## 2018-11-02 ENCOUNTER — Telehealth: Payer: Self-pay | Admitting: *Deleted

## 2018-11-02 NOTE — Telephone Encounter (Signed)
Called patient to offer virtual visit. She refused and said she was already asked to do this a month ago and prefers to be seen for her chest pain. Reports she just tested negative for Covid at the [facility] last Friday.  She saw Dr Cathlean Sauer at [facility] for chronic sinusitis. She has had decreased sense of taste and loss of smell since March but has tested negative twice for Covid. I was able to Dr Lonna Duval note in [facility] and see where he notes decreased sense of smell and taste.  Patient upset that we may not see her in person on Friday.  Let her know that I will update Dr End with this information and if any further questions or concerns I will let her know.

## 2018-11-02 NOTE — Telephone Encounter (Signed)
Ok for patient to be seen in the office on Friday as long as no new symptoms develop in the meantime.  Nelva Bush, MD [facility] Pager: [phone]

## 2018-11-02 NOTE — Progress Notes (Signed)
New Outpatient Visit Date: 11/04/2018  Referring Provider: Flora Lipps, [address] [place],  [place] [address]  Chief Complaint: Chest pain  HPI:  Elizabeth Butler is a 38 y.o. female who is being seen today for the evaluation of chest pain at the request of Dr. Mortimer Fries. She has a history of asthma and prediabetes.  She saw Dr. Mortimer Fries in early May, at which time she reported intermittent chest pain at rest and with exertion.  CTA chest in 07/2018 during ED visit showed no PE nor coronary/aortic calcification (personally reviewed).  Today, [name] reports that she has experienced intermittent chest pain since having the flu in February.  The pain comes and goes, though it seems to be happening more often over the last few days since she was started on amoxicillin for possible sinus infection.  She describes the pain as a punching and squeezing sensation; at times it feels like someone is sitting on her chest. The pain can last for a few minutes to more than one hour, sometimes in the center of her chest and at other times on the left.  It comes on at rest and with exertion (though not every time that she is active).  On one occasion, she also noted palpitations with her chest pain.  There are no other associated symptoms.  However, [name] notes that she began having GI upset around the time that the chest pain began as well.  [name] reports intermittent shortness of breath for some time as well as orthopnea.  She typically sleeps on a wedge pillow, though she is not sure what angle it has.  She has not had any edema or lightheadedness.  She snores frequently but was told that she does not have sleep apnea based on a sleep study.  [name] denies a personal history of heart disease.  Other than EKG's, she has never undergone heart testing.  --------------------------------------------------------------------------------------------------  Cardiovascular History & Procedures:  Cardiovascular Problems:  Chest pain  Risk Factors:  Prediabetes and obesity  Cath/PCI:  None  CV Surgery:  None  EP Procedures and Devices:  None  Non-Invasive Evaluation(s):  CTA chest (08/05/2018): No PE, TAA, or aortic dissection.  Clear lungs.  No coronary artery or aortic atherosclerotic calcification.  Focal hiatal hernia present.  Recent CV Pertinent Labs: Lab Results  Component Value Date   K 3.2 (L) 08/06/2018   MG 2.1 08/06/2018   BUN 8 08/06/2018   CREATININE 0.95 08/06/2018    --------------------------------------------------------------------------------------------------  Past Medical History:  Diagnosis Date  . Rhinitis     Past Surgical History:  Procedure Laterality Date  . TUBAL LIGATION      Current Meds  Medication Sig  . albuterol (PROVENTIL HFA;VENTOLIN HFA) 108 (90 Base) MCG/ACT inhaler Inhale 2 puffs into the lungs every 4 (four) hours as needed for wheezing or shortness of breath.  Marland Kitchen amoxicillin-clavulanate (AUGMENTIN) 875-125 MG tablet 2 (two) times a day.  . ipratropium (ATROVENT) 0.03 % nasal spray Place 2 sprays into both nostrils 3 (three) times daily.  . Multiple Vitamins-Minerals (MULTIPLE VITAMINS/WOMENS) tablet Take 1 tablet by mouth daily.    Allergies: Amoxicillin-pot clavulanate  Social History   Tobacco Use  . Smoking status: Never Smoker  . Smokeless tobacco: Never Used  Substance Use Topics  . Alcohol use: Not Currently  . Drug use: Never    Family History  Problem Relation Age of Onset  . Hypertension Mother     Review  of Systems: A 12-system review of systems was performed and was negative except as noted in the HPI.  --------------------------------------------------------------------------------------------------  Physical Exam: BP 110/84 (BP Location: Left Arm, Patient Position: Sitting, Cuff Size: Large)   Pulse 90   Ht 5\' 3"  (1.6 m)   Wt 215 lb (97.5 kg)   BMI 38.09 kg/m   General:  NAD  HEENT: No conjunctival pallor or scleral icterus. Face mask in place Neck: Supple without lymphadenopathy, thyromegaly, JVD, or HJR. No carotid bruit. Lungs: Normal work of breathing. Clear to auscultation bilaterally without wheezes or crackles. Heart: Regular rate and rhythm without murmurs, rubs, or gallops. Non-displaced PMI. Abd: Bowel sounds present. Soft, NT/ND without hepatosplenomegaly Ext: No lower extremity edema. Radial, PT, and DP pulses are 2+ bilaterally Skin: Warm and dry without rash. Neuro: CNIII-XII intact. Strength and fine-touch sensation intact in upper and lower extremities bilaterally. Psych: Normal mood and affect.  EKG:  NSR with low voltage in the precordial leads and nonspecific T wave changes.  Lab Results  Component Value Date   WBC 8.6 08/06/2018   HGB 10.6 (L) 08/06/2018   HCT 33.7 (L) 08/06/2018   MCV 85.5 08/06/2018   PLT 235 08/06/2018    Lab Results  Component Value Date   NA 138 08/06/2018   K 3.2 (L) 08/06/2018   CL 107 08/06/2018   CO2 24 08/06/2018   BUN 8 08/06/2018   CREATININE 0.95 08/06/2018   GLUCOSE 118 (H) 08/06/2018   ALT 18 08/05/2018    No results found for: CHOL, HDL, LDLCALC, LDLDIRECT, TRIG, CHOLHDL   --------------------------------------------------------------------------------------------------  ASSESSMENT AND PLAN: Atypical chest pain: Pain has been present since patient had flu in February.  ED workup in March was unrevealing.  CTA chest at that time was negative for PE and also showed no significant coronary artery calcification.  Given the patient's young age and lack of risk factors, I think it is very unlikely that her chest pain is due to obstructive CAD.  We have discussed conservative treatment versus ischemia testing and have agreed to defer testing for now.  I have recommended an empiric trial of famotidine 20 mg BID, as her symptoms may be related to GERD.  Follow-up: Return to clinic in 4-6 weeks.   Yvonne Kendallhristopher Dhaval Woo, MD 11/05/2018 6:49 PM

## 2018-11-02 NOTE — Telephone Encounter (Signed)
   Pt has been prescreened please review yes response below concerning symptoms. Pt also has concerns about heart fluttering in the past few days she's been experiencing it more often. She requested to move appointment sooner bc she knows her heart flutter's come and go and isn't sure if we will be able to pick up heart flutters the day of her appointment. Please advise.   COVID-19 Pre-Screening Questions:  . In the past 7 to 10 days have you had a cough,  shortness of breath, headache, congestion, fever (100 or greater) body aches, chills, sore throat, or sudden loss of taste or sense of smell? YES, Headaches/ loss of taste& sense of smell/ Pt feeling congested and believes she has sleep apnea. . Have you been around anyone with known Covid 19.  NO . Have you been around anyone who is awaiting Covid 19 test results in the past 7 to 10 days? NO . Have you been around anyone who has been exposed to Covid 19, or has mentioned symptoms of Covid 19 within the past 7 to 10 days? NO   If you have any concerns/questions about symptoms patients report during screening (either on the phone or at threshold). Contact the provider seeing the patient or DOD for further guidance.  If neither are available contact a member of the leadership team.

## 2018-11-02 NOTE — Telephone Encounter (Signed)
Discussed noted symptoms with patient. States she has chronic sinuitis and it causes her to have loss of smell and taste at times. States she was just tested at an urgent care and they called her yesterday and said she was negative. Advised I will run the symptoms by Dr End and let her know if any changes with appointment.

## 2018-11-04 ENCOUNTER — Other Ambulatory Visit: Payer: Self-pay

## 2018-11-04 ENCOUNTER — Ambulatory Visit (INDEPENDENT_AMBULATORY_CARE_PROVIDER_SITE_OTHER): Payer: Medicaid Other | Admitting: Internal Medicine

## 2018-11-04 ENCOUNTER — Encounter: Payer: Self-pay | Admitting: Internal Medicine

## 2018-11-04 ENCOUNTER — Encounter

## 2018-11-04 VITALS — BP 110/84 | HR 90 | Ht 63.0 in | Wt 215.0 lb

## 2018-11-04 DIAGNOSIS — R0789 Other chest pain: Secondary | ICD-10-CM

## 2018-11-04 MED ORDER — FAMOTIDINE 20 MG PO TABS: 20.0000 mg | ORAL_TABLET | Freq: Two times a day (BID) | ORAL | 0 refills | Status: DC

## 2018-11-04 NOTE — Patient Instructions (Addendum)
Medication Instructions:  Your physician has recommended you make the following change in your medication:  1- START Famotidine 20 mg by mouth two times a day. (over-the-counter)   If you need a refill on your cardiac medications before your next appointment, please call your pharmacy.   Lab work: - None ordered.  If you have labs (blood work) drawn today and your tests are completely normal, you will receive your results only by: Marland Kitchen MyChart Message (if you have MyChart) OR . A paper copy in the mail If you have any lab test that is abnormal or we need to change your treatment, we will call you to review the results.  Testing/Procedures: - None ordered.   Follow-Up: At [facility], you and your health needs are our priority.  As part of our continuing mission to provide you with exceptional heart care, we have created designated Provider Care Teams.  These Care Teams include your primary Cardiologist (physician) and Advanced Practice Providers (APPs -  Physician Assistants and Nurse Practitioners) who all work together to provide you with the care you need, when you need it. You will need a follow up appointment in 4-6 weeks  With Dr End or APP.  You may see DR Harrell Gave END or one of the following Advanced Practice Providers on your designated Care Team:   Murray Hodgkins, NP Christell Faith, PA-C . Marrianne Mood, PA-C

## 2018-11-08 ENCOUNTER — Encounter: Payer: Self-pay | Admitting: Emergency Medicine

## 2018-11-08 ENCOUNTER — Other Ambulatory Visit: Payer: Self-pay

## 2018-11-08 ENCOUNTER — Emergency Department: Payer: Medicaid Other

## 2018-11-08 ENCOUNTER — Emergency Department
Admission: EM | Admit: 2018-11-08 | Discharge: 2018-11-08 | Disposition: A | Discharge status: Home / Self Care | Payer: Medicaid Other | Attending: Student in an Organized Health Care Education/Training Program | Admitting: Student in an Organized Health Care Education/Training Program

## 2018-11-08 DIAGNOSIS — Z79899 Other long term (current) drug therapy: Secondary | ICD-10-CM | POA: Insufficient documentation

## 2018-11-08 DIAGNOSIS — R42 Dizziness and giddiness: Secondary | ICD-10-CM | POA: Diagnosis not present

## 2018-11-08 DIAGNOSIS — R202 Paresthesia of skin: Secondary | ICD-10-CM | POA: Diagnosis not present

## 2018-11-08 HISTORY — DX: Prediabetes: R73.03

## 2018-11-08 LAB — CBC
HCT: 37.2 % (ref 36.0–46.0)
Hemoglobin: 12 g/dL (ref 12.0–15.0)
MCH: 28.8 pg (ref 26.0–34.0)
MCHC: 32.3 g/dL (ref 30.0–36.0)
MCV: 89.4 fL (ref 80.0–100.0)
Platelets: 138 10*3/uL — ABNORMAL LOW (ref 150–400)
RBC: 4.16 MIL/uL (ref 3.87–5.11)
RDW: 14.4 % (ref 11.5–15.5)
WBC: 7.8 10*3/uL (ref 4.0–10.5)
nRBC: 0 % (ref 0.0–0.2)

## 2018-11-08 LAB — BASIC METABOLIC PANEL
Anion gap: 7 (ref 5–15)
BUN: 8 mg/dL (ref 6–20)
CO2: 22 mmol/L (ref 22–32)
Calcium: 9.2 mg/dL (ref 8.9–10.3)
Chloride: 110 mmol/L (ref 98–111)
Creatinine, Ser: 0.85 mg/dL (ref 0.44–1.00)
GFR calc Af Amer: >60 mL/min (ref >60)
GFR calc non Af Amer: >60 mL/min (ref >60)
Glucose, Bld: 102 mg/dL — ABNORMAL HIGH (ref 70–99)
Potassium: 3.8 mmol/L (ref 3.5–5.1)
Sodium: 139 mmol/L (ref 135–145)

## 2018-11-08 LAB — TROPONIN I (HIGH SENSITIVITY)
Troponin I (High Sensitivity): <2 ng/L (ref <18)
Troponin I (High Sensitivity): <2 ng/L (ref <18)

## 2018-11-08 MED ORDER — SODIUM CHLORIDE 0.9% FLUSH: 3.0000 mL | Freq: Once | INTRAVENOUS | Status: DC

## 2018-11-08 MED ORDER — ONDANSETRON HCL 4 MG/2ML IJ SOLN: 4.0000 mg | Freq: Once | INTRAMUSCULAR | Status: DC

## 2018-11-08 MED ORDER — SODIUM CHLORIDE 0.9 % IV BOLUS: 500.0000 mL | Freq: Once | INTRAVENOUS | Status: DC

## 2018-11-08 MED ORDER — PROMETHAZINE HCL 12.5 MG PO TABS: 12.5000 mg | ORAL_TABLET | Freq: Four times a day (QID) | ORAL | 0 refills | Status: DC | PRN

## 2018-11-08 NOTE — ED Notes (Signed)
Patient states she has felt nauseous with no vomiting or diarrhea x 2 days as well as very tired.  Patient states, "after I sleep and wake up, I don't feel like I slept at all."  Patient states she was taking a test today and she almost passed out.  Patient reports feeling weak and dizzy.  States others assisted her to a chair and she did not lose consciousness.  Patient is alert and oriented x 4 at this time.

## 2018-11-08 NOTE — ED Notes (Signed)
Per Dr Quentin Cornwall not to give fluids or zofran and to discharge pt

## 2018-11-08 NOTE — ED Notes (Signed)
Paper copy of discharge signed 

## 2018-11-08 NOTE — ED Provider Notes (Signed)
[facility] Emergency Department Provider Note    First MD Initiated Contact with Patient 11/08/18 1501     (approximate)  I have reviewed the triage vital signs and the nursing notes.   HISTORY  Chief Complaint Weakness, Arm Pain, Nausea, and Dizziness    HPI Elizabeth Butler is a 38 y.o. female with the below listed past medical history as well as a history of lightheadedness dizziness musculoskeletal chest pain and left arm pain presents the ER for feeling lightheaded and dizzy with some fatigue today.  Also has been having some intermittent left arm discomfort for the past 3-4 days.   She denies any chest pain or shortness of breath at this time.  Denies any history of cardiac illness.  Denies any diarrhea.  No vomiting.  States that she does feel like she gets dehydrated sometimes.  Denies any injury.  She does not smoke.  Has followed up with cardiologist due to near syncopal episodes with palpitations but is not on any medication has had reassuring work-up in the past.   Past Medical History:  Diagnosis Date  . Prediabetes   . Rhinitis    Family History  Problem Relation Age of Onset  . Hypertension Mother    Past Surgical History:  Procedure Laterality Date  . TUBAL LIGATION     Patient Active Problem List   Diagnosis Date Noted  . Asthma 08/10/2018  . Hypoxia 08/05/2018      Prior to Admission medications   Medication Sig Start Date End Date Taking? Authorizing Provider  albuterol (PROVENTIL HFA;VENTOLIN HFA) 108 (90 Base) MCG/ACT inhaler Inhale 2 puffs into the lungs every 4 (four) hours as needed for wheezing or shortness of breath. 08/06/18   Bettey Costa, MD  amoxicillin-clavulanate (AUGMENTIN) 875-125 MG tablet 2 (two) times a day. 10/26/18   [provider]  famotidine (PEPCID) 20 MG tablet Take 1 tablet (20 mg total) by mouth 2 (two) times daily. 11/04/18   End, Harrell Gave, MD  ipratropium (ATROVENT) 0.03 % nasal spray Place 2  sprays into both nostrils 3 (three) times daily. 09/04/18   Tereasa Coop, PA-C  Multiple Vitamins-Minerals (MULTIPLE VITAMINS/WOMENS) tablet Take 1 tablet by mouth daily.    [provider]  promethazine (PHENERGAN) 12.5 MG tablet Take 1 tablet (12.5 mg total) by mouth every 6 (six) hours as needed. 11/08/18   Merlyn Lot, MD    Allergies Amoxicillin-pot clavulanate    Social History Social History   Tobacco Use  . Smoking status: Never Smoker  . Smokeless tobacco: Never Used  Substance Use Topics  . Alcohol use: Not Currently  . Drug use: Never    Review of Systems Patient denies headaches, rhinorrhea, blurry vision, numbness, shortness of breath, chest pain, edema, cough, abdominal pain, nausea, vomiting, diarrhea, dysuria, fevers, rashes or hallucinations unless otherwise stated above in HPI. ____________________________________________   PHYSICAL EXAM:  VITAL SIGNS: Vitals:   11/08/18 1312 11/08/18 1430  BP: 124/60 127/67  Pulse: 68 79  Resp: 14 19  Temp: 99 F (37.2 C)   SpO2: 100% 100%   Constitutional: Alert and oriented.  Eyes: Conjunctivae are normal.  Head: Atraumatic. Nose: No congestion/rhinnorhea. Mouth/Throat: Mucous membranes are moist.   Neck: No stridor. Painless ROM.  Cardiovascular: Normal rate, regular rhythm. Grossly normal heart sounds.  Good peripheral circulation. Respiratory: Normal respiratory effort.  No retractions. Lungs CTAB. Gastrointestinal: Soft and nontender. No distention. No abdominal bruits. No CVA tenderness. Genitourinary:  Musculoskeletal: No lower extremity  tenderness nor edema.  No joint effusions. Neurologic:  Normal speech and language. No gross focal neurologic deficits are appreciated. No facial droop Skin:  Skin is warm, dry and intact. No rash noted. Psychiatric: Mood and affect are normal. Speech and behavior are normal.  ____________________________________________   LABS (all labs ordered are  listed, but only abnormal results are displayed)  Results for orders placed or performed during the hospital encounter of 11/08/18 (from the past 24 hour(s))  Basic metabolic panel     Status: Abnormal   Collection Time: 11/08/18  1:26 PM  Result Value Ref Range   Sodium 139 135 - 145 mmol/L   Potassium 3.8 3.5 - 5.1 mmol/L   Chloride 110 98 - 111 mmol/L   CO2 22 22 - 32 mmol/L   Glucose, Bld 102 (H) 70 - 99 mg/dL   BUN 8 6 - 20 mg/dL   Creatinine, Ser 1.610.85 0.44 - 1.00 mg/dL   Calcium 9.2 8.9 - 09.610.3 mg/dL   GFR calc non Af Amer >60 >60 mL/min   GFR calc Af Amer >60 >60 mL/min   Anion gap 7 5 - 15  CBC     Status: Abnormal   Collection Time: 11/08/18  1:26 PM  Result Value Ref Range   WBC 7.8 4.0 - 10.5 K/uL   RBC 4.16 3.87 - 5.11 MIL/uL   Hemoglobin 12.0 12.0 - 15.0 g/dL   HCT 04.537.2 40.936.0 - 81.146.0 %   MCV 89.4 80.0 - 100.0 fL   MCH 28.8 26.0 - 34.0 pg   MCHC 32.3 30.0 - 36.0 g/dL   RDW 91.414.4 78.211.5 - 95.615.5 %   Platelets 138 (L) 150 - 400 K/uL   nRBC 0.0 0.0 - 0.2 %  Troponin I (High Sensitivity)     Status: None   Collection Time: 11/08/18  1:26 PM  Result Value Ref Range   Troponin I (High Sensitivity) <2 <18 ng/L  Troponin I (High Sensitivity)     Status: None   Collection Time: 11/08/18  3:26 PM  Result Value Ref Range   Troponin I (High Sensitivity) <2 <18 ng/L   ____________________________________________  EKG My review and personal interpretation at Time: 13:20   Indication: weakness  Rate: 75  Rhythm: sinus Axis: normal Other: norma intervals, no stemi ____________________________________________  RADIOLOGY  I personally reviewed all radiographic images ordered to evaluate for the above acute complaints and reviewed radiology reports and findings.  These findings were personally discussed with the patient.  Please see medical record for radiology report.  ____________________________________________   PROCEDURES  Procedure(s) performed:  Procedures     Critical Care performed: no ____________________________________________   INITIAL IMPRESSION / ASSESSMENT AND PLAN / ED COURSE  Pertinent labs & imaging results that were available during my care of the patient were reviewed by me and considered in my medical decision making (see chart for details).   DDX: Hydration, dysrhythmia, ACS, musculoskeletal pain, radiculopathy, dissection  Elizabeth Butler is a 38 y.o. who presents to the ED with symptoms as described above.  She is well-appearing and nontoxic.  Neuro exam is reassuring.  No objective neuro deficit.  No trauma cervical spine.  No indication for cervical any imaging or neuroimaging.  Will give IV fluids.  EKG is nonischemic.  Does not seem consistent with ACS.  She has been seen multiple times for similar symptoms in the past.  Will give IV fluids and antiemetic and reassess.  Clinical Course as of Nov 08 1606  Tue Nov 08, 2018  1604 Repeat troponin negative.  No dysrhythmia on monitor.  Repeat neuro exam is reassuring.  Do suspect some musculoskeletal pain component may be some dehydration.  Resident intermittent numbness she does admit to sleeping on a pillow and has some intermittent neck spasms therefore I do suspect some component of possible torticollis or radiculopathy but given lack of neuro findings do not feel that emergent imaging clinically indicated.  I do believe she stable and appropriate for outpatient follow-up.   [PR]    Clinical Course User Index [PR] Willy Eddyobinson, Alesandra Smart, MD    The patient was evaluated in Emergency Department today for the symptoms described in the history of present illness. He/she was evaluated in the context of the global COVID-19 pandemic, which necessitated consideration that the patient might be at risk for infection with the SARS-CoV-2 virus that causes COVID-19. Institutional protocols and algorithms that pertain to the evaluation of patients at risk for COVID-19 are in a state of rapid change  based on information released by regulatory bodies including the CDC and federal and state organizations. These policies and algorithms were followed during the patient's care in the ED.   As part of my medical decision making, I reviewed the following data within the electronic MEDICAL RECORD NUMBER Nursing notes reviewed and incorporated, Labs reviewed, notes from prior ED visits and [place] Controlled Substance Database   ____________________________________________   FINAL CLINICAL IMPRESSION(S) / ED DIAGNOSES  Final diagnoses:  Dizziness  Arm paresthesia, left      NEW MEDICATIONS STARTED DURING THIS VISIT:  New Prescriptions   PROMETHAZINE (PHENERGAN) 12.5 MG TABLET    Take 1 tablet (12.5 mg total) by mouth every 6 (six) hours as needed.     Note:  This document was prepared using Dragon voice recognition software and may include unintentional dictation errors.    Willy Eddyobinson, Kahlel Peake, MD 11/08/18 [phone]

## 2018-11-08 NOTE — ED Triage Notes (Signed)
Triage by me 

## 2018-11-08 NOTE — ED Notes (Signed)
FIRST NURSE NOTE: PT was at school taking a test when she experienced a worsening dizziness. Dizziness and nausea reported x 2 days. Hx of anemia but no known source of bleeding.   109/79 90 Heart Rate 100%

## 2018-11-08 NOTE — ED Triage Notes (Signed)
Says left arm bothering her since Sunday and just feels fatigued.  Says today she started feeling dizzy.

## 2018-11-11 ENCOUNTER — Other Ambulatory Visit: Payer: Self-pay

## 2018-11-11 ENCOUNTER — Ambulatory Visit
Admission: EM | Admit: 2018-11-11 | Discharge: 2018-11-11 | Disposition: A | Discharge status: Home / Self Care | Payer: Medicaid Other | Attending: Family Medicine | Admitting: Family Medicine

## 2018-11-11 DIAGNOSIS — R350 Frequency of micturition: Secondary | ICD-10-CM | POA: Diagnosis not present

## 2018-11-11 DIAGNOSIS — R413 Other amnesia: Secondary | ICD-10-CM | POA: Insufficient documentation

## 2018-11-11 LAB — CBC WITH DIFFERENTIAL/PLATELET
Abs Immature Granulocytes: 0.02 10*3/uL (ref 0.00–0.07)
Basophils Absolute: 0 10*3/uL (ref 0.0–0.1)
Basophils Relative: 0 %
Eosinophils Absolute: 0 10*3/uL (ref 0.0–0.5)
Eosinophils Relative: 0 %
HCT: 36 % (ref 36.0–46.0)
Hemoglobin: 12.3 g/dL (ref 12.0–15.0)
Immature Granulocytes: 0 %
Lymphocytes Relative: 30 %
Lymphs Abs: 2.1 10*3/uL (ref 0.7–4.0)
MCH: 29.9 pg (ref 26.0–34.0)
MCHC: 34.2 g/dL (ref 30.0–36.0)
MCV: 87.6 fL (ref 80.0–100.0)
Monocytes Absolute: 0.6 10*3/uL (ref 0.1–1.0)
Monocytes Relative: 8 %
Neutro Abs: 4.4 10*3/uL (ref 1.7–7.7)
Neutrophils Relative %: 62 %
Platelets: 126 10*3/uL — ABNORMAL LOW (ref 150–400)
RBC: 4.11 MIL/uL (ref 3.87–5.11)
RDW: 14.2 % (ref 11.5–15.5)
WBC: 7.2 10*3/uL (ref 4.0–10.5)
nRBC: 0 % (ref 0.0–0.2)

## 2018-11-11 LAB — COMPREHENSIVE METABOLIC PANEL
ALT: 18 U/L (ref 0–44)
AST: 17 U/L (ref 15–41)
Albumin: 4.1 g/dL (ref 3.5–5.0)
Alkaline Phosphatase: 47 U/L (ref 38–126)
Anion gap: 7 (ref 5–15)
BUN: 8 mg/dL (ref 6–20)
CO2: 21 mmol/L — ABNORMAL LOW (ref 22–32)
Calcium: 8.9 mg/dL (ref 8.9–10.3)
Chloride: 108 mmol/L (ref 98–111)
Creatinine, Ser: 0.97 mg/dL (ref 0.44–1.00)
GFR calc Af Amer: >60 mL/min (ref >60)
GFR calc non Af Amer: >60 mL/min (ref >60)
Glucose, Bld: 96 mg/dL (ref 70–99)
Potassium: 3.6 mmol/L (ref 3.5–5.1)
Sodium: 136 mmol/L (ref 135–145)
Total Bilirubin: 1.1 mg/dL (ref 0.3–1.2)
Total Protein: 7.2 g/dL (ref 6.5–8.1)

## 2018-11-11 LAB — URINALYSIS, COMPLETE (UACMP) WITH MICROSCOPIC
Bilirubin Urine: NEGATIVE
Glucose, UA: NEGATIVE mg/dL
Hgb urine dipstick: NEGATIVE
Ketones, ur: NEGATIVE mg/dL
Leukocytes,Ua: NEGATIVE
Nitrite: NEGATIVE
Protein, ur: NEGATIVE mg/dL
RBC / HPF: NONE SEEN RBC/hpf (ref 0–5)
Specific Gravity, Urine: 1.01 (ref 1.005–1.030)
pH: 6 (ref 5.0–8.0)

## 2018-11-11 LAB — PREGNANCY, URINE: Preg Test, Ur: NEGATIVE

## 2018-11-11 MED ORDER — SULFAMETHOXAZOLE-TRIMETHOPRIM 800-160 MG PO TABS: 1.0000 | ORAL_TABLET | Freq: Two times a day (BID) | ORAL | 0 refills | Status: DC

## 2018-11-11 NOTE — Discharge Instructions (Signed)
Increase water intake If symptoms worsen or develops new neurologic symptoms, recommend patient go to Emergency Department Follow up with Primary Care provider

## 2018-11-11 NOTE — ED Provider Notes (Signed)
[facility]    CSN: [id number] Arrival date & time: 11/11/18  [id number]     History   Chief Complaint Chief Complaint  Patient presents with  . Altered Mental Status    HPI Elizabeth Butler is a 38 y.o. female.   38 yo female with a c/o feeling confused and "memory problems" as well as increased thirst, fatigue, dizziness and frequent urination for the past week. Patient was seen 4 days ago at [facility] ED with similar symptoms and work up negative.  Patient is able to recall that visit during questioning today. States she also has a h/o anxiety. Denies vision changes, chest pains, shortness of breath, numbness/tingling, one-sided weakness, nausea, vomiting.    Altered Mental Status   Past Medical History:  Diagnosis Date  . Prediabetes   . Rhinitis     Patient Active Problem List   Diagnosis Date Noted  . Asthma 08/10/2018  . Hypoxia 08/05/2018    Past Surgical History:  Procedure Laterality Date  . TUBAL LIGATION      OB History   No obstetric history on file.      Home Medications    Prior to Admission medications   Medication Sig Start Date End Date Taking? Authorizing Provider  albuterol (PROVENTIL HFA;VENTOLIN HFA) 108 (90 Base) MCG/ACT inhaler Inhale 2 puffs into the lungs every 4 (four) hours as needed for wheezing or shortness of breath. 08/06/18  Yes Mody, Sital, MD  ipratropium (ATROVENT) 0.03 % nasal spray Place 2 sprays into both nostrils 3 (three) times daily. 09/04/18  Yes Ofilia Neaslark, Michael L, PA-C  Multiple Vitamins-Minerals (MULTIPLE VITAMINS/WOMENS) tablet Take 1 tablet by mouth daily.   Yes [provider]  amoxicillin-clavulanate (AUGMENTIN) 875-125 MG tablet 2 (two) times a day. 10/26/18   [provider]  famotidine (PEPCID) 20 MG tablet Take 1 tablet (20 mg total) by mouth 2 (two) times daily. 11/04/18   End, Cristal Deerhristopher, MD  promethazine (PHENERGAN) 12.5 MG tablet Take 1 tablet (12.5 mg total) by mouth every 6 (six) hours as  needed. 11/08/18   Willy Eddyobinson, Patrick, MD  sulfamethoxazole-trimethoprim (BACTRIM DS) 800-160 MG tablet Take 1 tablet by mouth 2 (two) times daily. 11/11/18   Payton Mccallumonty, Tamani Durney, MD    Family History Family History  Problem Relation Age of Onset  . Hypertension Mother     Social History Social History   Tobacco Use  . Smoking status: Never Smoker  . Smokeless tobacco: Never Used  Substance Use Topics  . Alcohol use: Not Currently  . Drug use: Never     Allergies   Amoxicillin-pot clavulanate   Review of Systems Review of Systems   Physical Exam Triage Vital Signs ED Triage Vitals  Enc Vitals Group     BP 11/11/18 0846 (!) 120/54     Pulse Rate 11/11/18 0846 90     Resp 11/11/18 0846 16     Temp 11/11/18 0846 98.9 F (37.2 C)     Temp Source 11/11/18 0846 Oral     SpO2 11/11/18 0846 100 %     Weight 11/11/18 0846 215 lb (97.5 kg)     Height 11/11/18 0846 5\' 3"  (1.6 m)     Head Circumference --      Peak Flow --      Pain Score 11/11/18 0845 0     Pain Loc --      Pain Edu? --      Excl. in GC? --    No data found.  Updated Vital Signs BP (!) 120/54 (BP Location: Left Arm)   Pulse 90   Temp 98.9 F (37.2 C) (Oral)   Resp 16   Ht 5\' 3"  (1.6 m)   Wt 97.5 kg   LMP 10/18/2018   SpO2 100%   BMI 38.09 kg/m   Visual Acuity Right Eye Distance:   Left Eye Distance:   Bilateral Distance:    Right Eye Near:   Left Eye Near:    Bilateral Near:     Physical Exam Vitals signs and nursing note reviewed.  Constitutional:      General: She is not in acute distress.    Appearance: She is not toxic-appearing or diaphoretic.  Eyes:     Extraocular Movements: Extraocular movements intact.     Pupils: Pupils are equal, round, and reactive to light.  Neck:     Musculoskeletal: Normal range of motion and neck supple. No neck rigidity.  Cardiovascular:     Rate and Rhythm: Normal rate and regular rhythm.     Pulses: Normal pulses.     Heart sounds: Normal heart  sounds.  Pulmonary:     Effort: Pulmonary effort is normal. No respiratory distress.     Breath sounds: Normal breath sounds. No stridor. No wheezing, rhonchi or rales.  Neurological:     General: No focal deficit present.     Mental Status: She is alert and oriented to person, place, and time.     Cranial Nerves: No cranial nerve deficit.     Sensory: No sensory deficit.     Motor: No weakness.     Coordination: Coordination normal.     Gait: Gait normal.     Deep Tendon Reflexes: Reflexes normal.      UC Treatments / Results  Labs (all labs ordered are listed, but only abnormal results are displayed) Labs Reviewed  COMPREHENSIVE METABOLIC PANEL - Abnormal; Notable for the following components:      Result Value   CO2 21 (*)    All other components within normal limits  CBC WITH DIFFERENTIAL/PLATELET - Abnormal; Notable for the following components:   Platelets 126 (*)    All other components within normal limits  URINALYSIS, COMPLETE (UACMP) WITH MICROSCOPIC - Abnormal; Notable for the following components:   Color, Urine STRAW (*)    Bacteria, UA FEW (*)    All other components within normal limits  PREGNANCY, URINE    EKG   Radiology No results found.  Procedures Procedures (including critical care time)  Medications Ordered in UC Medications - No data to display  Initial Impression / Assessment and Plan / UC Course  I have reviewed the triage vital signs and the nursing notes.  Pertinent labs & imaging results that were available during my care of the patient were reviewed by me and considered in my medical decision making (see chart for details).      Final Clinical Impressions(s) / UC Diagnoses   Final diagnoses:  Urinary frequency  Memory problem     Discharge Instructions     Increase water intake If symptoms worsen or develops new neurologic symptoms, recommend patient go to Emergency Department Follow up with Primary Care provider     ED Prescriptions    Medication Sig Dispense Auth. Provider   sulfamethoxazole-trimethoprim (BACTRIM DS) 800-160 MG tablet Take 1 tablet by mouth 2 (two) times daily. 6 tablet Norval Gable, MD     1. Lab results (normal/negative blood work, few bacteria urine) and diagnosis  reviewed with patient and fiancee 2. rx as per orders above; reviewed possible side effects, interactions, risks and benefits  3. Recommend supportive treatment as above  4. Follow-up prn if symptoms worsen or don't improve  Controlled Substance Prescriptions [place] Controlled Substance Registry consulted? Not Applicable   Payton Mccallumonty, Darion Juhasz, MD 11/11/18 [phone]

## 2018-11-11 NOTE — ED Triage Notes (Signed)
Patient states that she feels very confused and unable to remember whats going on with her. Patient states that she is having increased thirst, fatigue, sweating, dizziness, and little urination. Patient tearful throughout triage, was too unsteady to walk back to exam room.

## 2018-11-13 LAB — URINE CULTURE
Culture: <10000 — AB
Special Requests: NORMAL

## 2018-11-14 ENCOUNTER — Ambulatory Visit: Payer: Medicaid Other | Admitting: Internal Medicine

## 2018-11-30 ENCOUNTER — Ambulatory Visit: Payer: Medicaid Other

## 2018-12-09 ENCOUNTER — Telehealth: Payer: Self-pay | Admitting: Internal Medicine

## 2018-12-09 NOTE — Telephone Encounter (Signed)

## 2018-12-12 ENCOUNTER — Ambulatory Visit: Payer: Medicaid Other | Admitting: Internal Medicine

## 2018-12-12 NOTE — Progress Notes (Deleted)
   Follow-up Outpatient Visit Date: 12/12/2018  Primary Care Provider: Chase Picket, [place] [address]  Chief Complaint: ***  HPI:  Ms. Verga is a 38 y.o. year-old female with history of asthma and prediabetes, who presents for follow-up of chest pain.  I met Ms. Tidd in late June for evaluation of intermittent chest pain since contracting influenza in February.  She also noted intermittent shortness of breath and orthopnea.  Preceding CTA of the chest in the ED in March was negative for PE as well as coronary artery calcification.  Given atypical nature of chest pain, we agreed to an empiric trial of famotidine 20 mg twice daily.  --------------------------------------------------------------------------------------------------  Cardiovascular History & Procedures: Cardiovascular Problems:  Chest pain  Risk Factors:  Prediabetes and obesity  Cath/PCI:  None  CV Surgery:  None  EP Procedures and Devices:  None  Non-Invasive Evaluation(s):  CTA chest (08/05/2018): No PE, TAA, or aortic dissection.  Clear lungs.  No coronary artery or aortic atherosclerotic calcification.  Focal hiatal hernia present.  Recent CV Pertinent Labs: Lab Results  Component Value Date   K 3.6 11/11/2018   MG 2.1 08/06/2018   BUN 8 11/11/2018   CREATININE 0.97 11/11/2018    Past medical and surgical history were reviewed and updated in EPIC.  No outpatient medications have been marked as taking for the 12/12/18 encounter (Appointment) with Vanessia Bokhari, Harrell Gave, MD.    Allergies: Amoxicillin-pot clavulanate  Social History   Tobacco Use  . Smoking status: Never Smoker  . Smokeless tobacco: Never Used  Substance Use Topics  . Alcohol use: Not Currently  . Drug use: Never    Family History  Problem Relation Age of Onset  . Hypertension Mother     Review of Systems: A 12-system review of systems was performed and was negative except as noted in  the HPI.  --------------------------------------------------------------------------------------------------  Physical Exam: There were no vitals taken for this visit.  General:  *** HEENT: No conjunctival pallor or scleral icterus. Moist mucous membranes.  OP clear. Neck: Supple without lymphadenopathy, thyromegaly, JVD, or HJR. No carotid bruit. Lungs: Normal work of breathing. Clear to auscultation bilaterally without wheezes or crackles. Heart: Regular rate and rhythm without murmurs, rubs, or gallops. Non-displaced PMI. Abd: Bowel sounds present. Soft, NT/ND without hepatosplenomegaly Ext: No lower extremity edema. Radial, PT, and DP pulses are 2+ bilaterally. Skin: Warm and dry without rash.  EKG:  ***  Lab Results  Component Value Date   WBC 7.2 11/11/2018   HGB 12.3 11/11/2018   HCT 36.0 11/11/2018   MCV 87.6 11/11/2018   PLT 126 (L) 11/11/2018    Lab Results  Component Value Date   NA 136 11/11/2018   K 3.6 11/11/2018   CL 108 11/11/2018   CO2 21 (L) 11/11/2018   BUN 8 11/11/2018   CREATININE 0.97 11/11/2018   GLUCOSE 96 11/11/2018   ALT 18 11/11/2018    No results found for: CHOL, HDL, LDLCALC, LDLDIRECT, TRIG, CHOLHDL  --------------------------------------------------------------------------------------------------  ASSESSMENT AND PLAN: ***  Nelva Bush, MD 12/12/2018 6:51 AM

## 2018-12-21 ENCOUNTER — Ambulatory Visit: Payer: Medicaid Other | Admitting: Internal Medicine

## 2019-01-08 ENCOUNTER — Ambulatory Visit
Admission: EM | Admit: 2019-01-08 | Discharge: 2019-01-08 | Disposition: A | Discharge status: Home / Self Care | Payer: Medicaid Other | Attending: Emergency Medicine | Admitting: Emergency Medicine

## 2019-01-08 ENCOUNTER — Other Ambulatory Visit: Payer: Self-pay

## 2019-01-08 DIAGNOSIS — D696 Thrombocytopenia, unspecified: Secondary | ICD-10-CM | POA: Insufficient documentation

## 2019-01-08 DIAGNOSIS — D649 Anemia, unspecified: Secondary | ICD-10-CM | POA: Diagnosis not present

## 2019-01-08 LAB — CBC WITH DIFFERENTIAL/PLATELET
Abs Immature Granulocytes: 0.02 10*3/uL (ref 0.00–0.07)
Basophils Absolute: 0 10*3/uL (ref 0.0–0.1)
Basophils Relative: 0 %
Eosinophils Absolute: 0 10*3/uL (ref 0.0–0.5)
Eosinophils Relative: 0 %
HCT: 29 % — ABNORMAL LOW (ref 36.0–46.0)
Hemoglobin: 9.5 g/dL — ABNORMAL LOW (ref 12.0–15.0)
Immature Granulocytes: 0 %
Lymphocytes Relative: 29 %
Lymphs Abs: 2.5 10*3/uL (ref 0.7–4.0)
MCH: 29.5 pg (ref 26.0–34.0)
MCHC: 32.8 g/dL (ref 30.0–36.0)
MCV: 90.1 fL (ref 80.0–100.0)
Monocytes Absolute: 0.5 10*3/uL (ref 0.1–1.0)
Monocytes Relative: 5 %
Neutro Abs: 5.6 10*3/uL (ref 1.7–7.7)
Neutrophils Relative %: 66 %
Platelets: 49 10*3/uL — ABNORMAL LOW (ref 150–400)
RBC: 3.22 MIL/uL — ABNORMAL LOW (ref 3.87–5.11)
RDW: 18.5 % — ABNORMAL HIGH (ref 11.5–15.5)
WBC: 8.6 10*3/uL (ref 4.0–10.5)
nRBC: 0 % (ref 0.0–0.2)

## 2019-01-08 LAB — PREGNANCY, URINE: Preg Test, Ur: NEGATIVE

## 2019-01-08 NOTE — ED Provider Notes (Signed)
HPI  SUBJECTIVE:  Elizabeth Butler is a 38 y.o. female who presents with easy bruising over her extremities for the past 2 days.  She denies any known trauma.  She reports vaginal bleeding described as "spotting".  She states that her cycle is late.  Normally has regular periods.  She reports mild bleeding from her gums when she brushes her teeth, but no worse than usual.  She has been healthy up until today.  No recent viral illnesses.  No epistaxis, hemoptysis, melena, hematochezia, heavy vaginal bleeding.  No bruising elsewhere.  No abdominal pain, rash.  No aggravating or alleviating factors.  She has not tried anything for this.  She states that her recent platelet count was 70 2 weeks ago, states that she has an appointment with a hematologist in January for work-up.  She has a past medical history of thrombocytopenia diagnosed in June 2020.  No history of hepatitis, liver disease, alcohol use, diabetes, hypertension, leukemia.  LMP: 7/29.  Is not sure if she could be pregnant.  PMD: Dr. Alinda Money in [facility].  Past Medical History:  Diagnosis Date  . Prediabetes   . Rhinitis     Past Surgical History:  Procedure Laterality Date  . TUBAL LIGATION      Family History  Problem Relation Age of Onset  . Hypertension Mother     Social History   Tobacco Use  . Smoking status: Never Smoker  . Smokeless tobacco: Never Used  Substance Use Topics  . Alcohol use: Not Currently  . Drug use: Never    No current facility-administered medications for this encounter.   Current Outpatient Medications:  .  albuterol (PROVENTIL HFA;VENTOLIN HFA) 108 (90 Base) MCG/ACT inhaler, Inhale 2 puffs into the lungs every 4 (four) hours as needed for wheezing or shortness of breath., Disp: 1 Inhaler, Rfl: 0 .  azelastine (ASTELIN) 0.1 % nasal spray, U 2 SPRAYS IEN BID, Disp: , Rfl:  .  fluticasone (FLONASE) 50 MCG/ACT nasal spray, SHAKE LQ AND U 2 SPRAYS IEN QD FOR 7 DAYS, Disp: , Rfl:  .  ipratropium  (ATROVENT) 0.03 % nasal spray, Place 2 sprays into both nostrils 3 (three) times daily., Disp: 30 mL, Rfl: 12 .  Multiple Vitamins-Minerals (MULTIPLE VITAMINS/WOMENS) tablet, Take 1 tablet by mouth daily., Disp: , Rfl:   Allergies  Allergen Reactions  . Amoxicillin-Pot Clavulanate Other (See Comments)    Palpitations, sweats, abdominal cramping, soft stools, Anxiety     ROS  As noted in HPI.   Physical Exam  BP (!) 114/57 (BP Location: Left Arm)   Pulse 93   Temp 99.3 F (37.4 C) (Oral)   Resp 18   Ht 5\' 3"  (1.6 m)   Wt 95.3 kg   LMP 11/30/2018   SpO2 100%   BMI 37.20 kg/m   Constitutional: Well developed, well nourished, no acute distress Eyes:  EOMI, conjunctiva normal bilaterally.  HENT: Normocephalic, atraumatic,mucus membranes moist Respiratory: Normal inspiratory effort Cardiovascular: Normal rate GI: nondistended skin: No rash, skin intact.  Scattered small bruises in various stages of healing lower extremities,  several on upper extremities.  No purpura.  No large ecchymosis. Musculoskeletal: no deformities Neurologic: Alert & oriented x 3, no focal neuro deficits Psychiatric: Speech and behavior appropriate   ED Course   Medications - No data to display  Orders Placed This Encounter  Procedures  . Pregnancy, urine    Standing Status:   Standing    Number of Occurrences:   1  .  CBC with Differential    Standing Status:   Standing    Number of Occurrences:   1    Results for orders placed or performed during the hospital encounter of 01/08/19 (from the past 24 hour(s))  Pregnancy, urine     Status: None   Collection Time: 01/08/19 10:48 AM  Result Value Ref Range   Preg Test, Ur NEGATIVE NEGATIVE  CBC with Differential     Status: Abnormal   Collection Time: 01/08/19 10:48 AM  Result Value Ref Range   WBC 8.6 4.0 - 10.5 K/uL   RBC 3.22 (L) 3.87 - 5.11 MIL/uL   Hemoglobin 9.5 (L) 12.0 - 15.0 g/dL   HCT 16.129.0 (L) 09.636.0 - 04.546.0 %   MCV 90.1 80.0 -  100.0 fL   MCH 29.5 26.0 - 34.0 pg   MCHC 32.8 30.0 - 36.0 g/dL   RDW 40.918.5 (H) 81.111.5 - 91.415.5 %   Platelets 49 (L) 150 - 400 K/uL   nRBC 0.0 0.0 - 0.2 %   Neutrophils Relative % 66 %   Neutro Abs 5.6 1.7 - 7.7 K/uL   Lymphocytes Relative 29 %   Lymphs Abs 2.5 0.7 - 4.0 K/uL   Monocytes Relative 5 %   Monocytes Absolute 0.5 0.1 - 1.0 K/uL   Eosinophils Relative 0 %   Eosinophils Absolute 0.0 0.0 - 0.5 K/uL   Basophils Relative 0 %   Basophils Absolute 0.0 0.0 - 0.1 K/uL   Immature Granulocytes 0 %   Abs Immature Granulocytes 0.02 0.00 - 0.07 K/uL   No results found.  ED Clinical Impression  1. Thrombocytopenia (HCC)   2. Anemia, unspecified type      ED Assessment/Plan   Previous outside labs reviewed.  [facility].  She was thrombocytopenic on outside labs at 70 on 8/18.  Her hemoglobin was 10.9. Will check a CBC, urine pregnancy.  Urine pregnancy negative.  Patient is anemic, previous hemoglobin was 10.9, today 9.5.  Baseline seems to be in between 10.4-12. she is thrombocytopenic.  She has no signs or symptoms of anemia.  No chest pain, shortness of breath, craving for ice.  She does not report any life-threatening bleeding.  Unsure as to cause of her thrombocytopenia.  Does not seem to be an emergency today.  Will refer to Dr. Cathie HoopsYu, hematology/oncology on-call for further work-up.  Advised no NSAIDs.  Discussed labs,MDM, treatment plan, and plan for follow-up with patient. Discussed sn/sx that should prompt return to the ED. patient agrees with plan.   No orders of the defined types were placed in this encounter.   *This clinic note was created using Dragon dictation software. Therefore, there may be occasional mistakes despite careful proofreading.   ?   Domenick GongMortenson, Gracelin Weisberg, MD 01/08/19 1755

## 2019-01-08 NOTE — ED Triage Notes (Signed)
Patient states that she has low platelets and would like to have them checked today. States that she woke up 2 days with bruising on her legs.

## 2019-01-08 NOTE — Discharge Instructions (Signed)
Your platelets are lower today than they were a month ago.  They are 43 today.  You need to have this worked up by hematology oncology.  Follow-up with Dr. you.  Go immediately to the ER for nosebleeds that do not stop with 20 minutes of pressure, severe, heavy vaginal bleeding, blood in your stool, black or tarry stools, chest pain, shortness of breath, if you pass out.

## 2019-01-09 ENCOUNTER — Other Ambulatory Visit: Payer: Self-pay | Admitting: *Deleted

## 2019-01-09 DIAGNOSIS — D696 Thrombocytopenia, unspecified: Secondary | ICD-10-CM

## 2019-01-09 NOTE — Progress Notes (Signed)
Per Dr Alphonzo Cruise, verbal request for Hematology referral to be send to [facility] for Thrombocytopenia. Referral placed and routed to her for sign off.

## 2019-01-11 NOTE — Progress Notes (Signed)
Patient is coming in as new patient

## 2019-01-12 ENCOUNTER — Other Ambulatory Visit: Payer: Self-pay

## 2019-01-12 ENCOUNTER — Inpatient Hospital Stay: Payer: Medicaid Other | Attending: Oncology | Admitting: Oncology

## 2019-01-12 ENCOUNTER — Encounter: Payer: Self-pay | Admitting: Oncology

## 2019-01-12 ENCOUNTER — Inpatient Hospital Stay: Payer: Medicaid Other

## 2019-01-12 VITALS — BP 129/80 | HR 89 | Temp 98.1°F | Resp 18 | Wt 202.8 lb

## 2019-01-12 DIAGNOSIS — R410 Disorientation, unspecified: Secondary | ICD-10-CM | POA: Insufficient documentation

## 2019-01-12 DIAGNOSIS — R55 Syncope and collapse: Secondary | ICD-10-CM | POA: Insufficient documentation

## 2019-01-12 DIAGNOSIS — D696 Thrombocytopenia, unspecified: Secondary | ICD-10-CM

## 2019-01-12 DIAGNOSIS — R110 Nausea: Secondary | ICD-10-CM | POA: Diagnosis not present

## 2019-01-12 DIAGNOSIS — R413 Other amnesia: Secondary | ICD-10-CM | POA: Insufficient documentation

## 2019-01-12 DIAGNOSIS — D649 Anemia, unspecified: Secondary | ICD-10-CM

## 2019-01-12 DIAGNOSIS — R42 Dizziness and giddiness: Secondary | ICD-10-CM | POA: Insufficient documentation

## 2019-01-12 DIAGNOSIS — Z8249 Family history of ischemic heart disease and other diseases of the circulatory system: Secondary | ICD-10-CM | POA: Diagnosis not present

## 2019-01-12 DIAGNOSIS — R569 Unspecified convulsions: Secondary | ICD-10-CM | POA: Insufficient documentation

## 2019-01-12 DIAGNOSIS — Z800 Family history of malignant neoplasm of digestive organs: Secondary | ICD-10-CM | POA: Diagnosis not present

## 2019-01-12 DIAGNOSIS — Z79899 Other long term (current) drug therapy: Secondary | ICD-10-CM | POA: Insufficient documentation

## 2019-01-12 DIAGNOSIS — R5383 Other fatigue: Secondary | ICD-10-CM | POA: Diagnosis not present

## 2019-01-12 DIAGNOSIS — K449 Diaphragmatic hernia without obstruction or gangrene: Secondary | ICD-10-CM | POA: Insufficient documentation

## 2019-01-12 DIAGNOSIS — R350 Frequency of micturition: Secondary | ICD-10-CM | POA: Diagnosis not present

## 2019-01-12 DIAGNOSIS — Z880 Allergy status to penicillin: Secondary | ICD-10-CM | POA: Insufficient documentation

## 2019-01-12 LAB — IRON AND TIBC
Iron: 54 ug/dL (ref 28–170)
Saturation Ratios: 17 % (ref 10.4–31.8)
TIBC: 325 ug/dL (ref 250–450)
UIBC: 271 ug/dL

## 2019-01-12 LAB — COMPREHENSIVE METABOLIC PANEL
ALT: 20 U/L (ref 0–44)
AST: 16 U/L (ref 15–41)
Albumin: 4.4 g/dL (ref 3.5–5.0)
Alkaline Phosphatase: 45 U/L (ref 38–126)
Anion gap: 7 (ref 5–15)
BUN: 8 mg/dL (ref 6–20)
CO2: 25 mmol/L (ref 22–32)
Calcium: 9.2 mg/dL (ref 8.9–10.3)
Chloride: 107 mmol/L (ref 98–111)
Creatinine, Ser: 0.94 mg/dL (ref 0.44–1.00)
GFR calc Af Amer: >60 mL/min (ref >60)
GFR calc non Af Amer: >60 mL/min (ref >60)
Glucose, Bld: 103 mg/dL — ABNORMAL HIGH (ref 70–99)
Potassium: 4 mmol/L (ref 3.5–5.1)
Sodium: 139 mmol/L (ref 135–145)
Total Bilirubin: 1 mg/dL (ref 0.3–1.2)
Total Protein: 7.2 g/dL (ref 6.5–8.1)

## 2019-01-12 LAB — CBC WITH DIFFERENTIAL/PLATELET
Abs Immature Granulocytes: 0.02 10*3/uL (ref 0.00–0.07)
Basophils Absolute: 0 10*3/uL (ref 0.0–0.1)
Basophils Relative: 0 %
Eosinophils Absolute: 0 10*3/uL (ref 0.0–0.5)
Eosinophils Relative: 0 %
HCT: 29.7 % — ABNORMAL LOW (ref 36.0–46.0)
Hemoglobin: 9.5 g/dL — ABNORMAL LOW (ref 12.0–15.0)
Immature Granulocytes: 0 %
Lymphocytes Relative: 28 %
Lymphs Abs: 2 10*3/uL (ref 0.7–4.0)
MCH: 29.6 pg (ref 26.0–34.0)
MCHC: 32 g/dL (ref 30.0–36.0)
MCV: 92.5 fL (ref 80.0–100.0)
Monocytes Absolute: 0.3 10*3/uL (ref 0.1–1.0)
Monocytes Relative: 4 %
Neutro Abs: 5 10*3/uL (ref 1.7–7.7)
Neutrophils Relative %: 68 %
Platelets: 133 10*3/uL — ABNORMAL LOW (ref 150–400)
RBC: 3.21 MIL/uL — ABNORMAL LOW (ref 3.87–5.11)
RDW: 18.4 % — ABNORMAL HIGH (ref 11.5–15.5)
WBC: 7.3 10*3/uL (ref 4.0–10.5)
nRBC: 0 % (ref 0.0–0.2)

## 2019-01-12 LAB — FOLATE: Folate: 33 ng/mL (ref >5.9)

## 2019-01-12 LAB — VITAMIN B12: Vitamin B-12: 718 pg/mL (ref 180–914)

## 2019-01-12 LAB — TECHNOLOGIST SMEAR REVIEW: Plt Morphology: DECREASED

## 2019-01-12 LAB — LACTATE DEHYDROGENASE: LDH: 198 U/L — ABNORMAL HIGH (ref 98–192)

## 2019-01-12 LAB — FERRITIN: Ferritin: 32 ng/mL (ref 11–307)

## 2019-01-12 NOTE — Progress Notes (Signed)
New patient visit for thrombocytopenia, reports being "very tired".

## 2019-01-13 LAB — HEPATITIS PANEL, ACUTE
HCV Ab: <0.1 s/co ratio (ref 0.0–0.9)
Hep A IgM: NEGATIVE
Hep B C IgM: NEGATIVE
Hepatitis B Surface Ag: NEGATIVE

## 2019-01-13 LAB — ANTINUCLEAR ANTIBODIES, IFA: ANA Ab, IFA: NEGATIVE

## 2019-01-13 NOTE — Progress Notes (Signed)
Hematology/Oncology Consult note [facility] [phone] [phone]   Patient Care Team: Chase Picket, MD as PCP - General (Family Medicine)  REFERRING PROVIDER: Melynda Ripple, MD  CHIEF COMPLAINTS/REASON FOR VISIT:  Evaluation of thrombocytopenia  HISTORY OF PRESENTING ILLNESS:   Elizabeth Butler is a  38 y.o.  female with PMH listed below was seen in consultation at the request of  Melynda Ripple, MD  for evaluation of thrombocytopenia She had recent blood work done, hemoglobin 9.5, MCV 90, wbc 8.6, platelet count 49,000. Normal differential.  She has normal hemoglobin count on 11/11/2018 with hemoglobin 12.3.  Patient is very anxious.  Denies any bleeding, fever chills, shortness of breath, denies any new medication or herbal supplements.  ED visit on 11/11/2018, due to feeling confused, memory problems, increased thirst, dizziness, frequent urination.  Negative blood work up. Few bacteria, was discharged with Bactrim for presumed UTI. Urine culture grew ,10,000 colonies.  ED Visit on 01/08/2019, easy bruising for 2 days. Also vaginal spot bleeding.    Review of Systems  Constitutional: Positive for fatigue. Negative for appetite change, chills and fever.  HENT:   Negative for hearing loss and voice change.   Eyes: Negative for eye problems.  Respiratory: Negative for chest tightness and cough.   Cardiovascular: Negative for chest pain.  Gastrointestinal: Negative for abdominal distention, abdominal pain and blood in stool.  Endocrine: Negative for hot flashes.  Genitourinary: Negative for difficulty urinating and frequency.   Musculoskeletal: Negative for arthralgias.  Skin: Negative for itching and rash.  Neurological: Negative for extremity weakness.  Hematological: Negative for adenopathy. Bruises/bleeds easily.  Psychiatric/Behavioral: Negative for confusion.    MEDICAL HISTORY:  Past Medical History:  Diagnosis Date   . Prediabetes   . Rhinitis     SURGICAL HISTORY: Past Surgical History:  Procedure Laterality Date  . TUBAL LIGATION      SOCIAL HISTORY: Social History   Socioeconomic History  . Marital status: Single    Spouse name: Not on file  . Number of children: Not on file  . Years of education: Not on file  . Highest education level: Not on file  Occupational History  . Not on file  Social Needs  . Financial resource strain: Not on file  . Food insecurity    Worry: Not on file    Inability: Not on file  . Transportation needs    Medical: Not on file    Non-medical: Not on file  Tobacco Use  . Smoking status: Never Smoker  . Smokeless tobacco: Never Used  Substance and Sexual Activity  . Alcohol use: Not Currently  . Drug use: Never  . Sexual activity: Not on file  Lifestyle  . Physical activity    Days per week: Not on file    Minutes per session: Not on file  . Stress: Not on file  Relationships  . Social Herbalist on phone: Not on file    Gets together: Not on file    Attends religious service: Not on file    Active member of club or organization: Not on file    Attends meetings of clubs or organizations: Not on file    Relationship status: Not on file  . Intimate partner violence    Fear of current or ex partner: Not on file    Emotionally abused: Not on file    Physically abused: Not on file    Forced sexual activity: Not on file  Other Topics Concern  . Not on file  Social History Narrative  . Not on file    FAMILY HISTORY: Family History  Problem Relation Age of Onset  . Hypertension Mother   . Colon cancer Maternal Grandmother     ALLERGIES:  is allergic to amoxicillin-pot clavulanate.  MEDICATIONS:  Current Outpatient Medications  Medication Sig Dispense Refill  . albuterol (PROVENTIL HFA;VENTOLIN HFA) 108 (90 Base) MCG/ACT inhaler Inhale 2 puffs into the lungs every 4 (four) hours as needed for wheezing or shortness of breath. 1  Inhaler 0  . azelastine (ASTELIN) 0.1 % nasal spray U 2 SPRAYS IEN BID    . fluticasone (FLONASE) 50 MCG/ACT nasal spray SHAKE LQ AND U 2 SPRAYS IEN QD FOR 7 DAYS    . ipratropium (ATROVENT) 0.03 % nasal spray Place 2 sprays into both nostrils 3 (three) times daily. 30 mL 12  . Multiple Vitamins-Minerals (MULTIPLE VITAMINS/WOMENS) tablet Take 1 tablet by mouth daily.     No current facility-administered medications for this visit.      PHYSICAL EXAMINATION: ECOG PERFORMANCE STATUS: 1 - Symptomatic but completely ambulatory Vitals:   01/12/19 1050  BP: 129/80  Pulse: 89  Resp: 18  Temp: 98.1 F (36.7 C)  SpO2: 100%   Filed Weights   01/12/19 1050  Weight: 202 lb 12.8 oz (92 kg)    Physical Exam Constitutional:      General: She is not in acute distress. HENT:     Head: Normocephalic and atraumatic.  Eyes:     General: No scleral icterus.    Pupils: Pupils are equal, round, and reactive to light.  Neck:     Musculoskeletal: Normal range of motion and neck supple.  Cardiovascular:     Rate and Rhythm: Normal rate and regular rhythm.     Heart sounds: Normal heart sounds.  Pulmonary:     Effort: Pulmonary effort is normal. No respiratory distress.     Breath sounds: No wheezing.  Abdominal:     General: Bowel sounds are normal. There is no distension.     Palpations: Abdomen is soft. There is no mass.     Tenderness: There is no abdominal tenderness.  Musculoskeletal: Normal range of motion.        General: No deformity.  Skin:    General: Skin is warm and dry.     Findings: No erythema or rash.  Neurological:     Mental Status: She is alert and oriented to person, place, and time.     Cranial Nerves: No cranial nerve deficit.     Coordination: Coordination normal.  Psychiatric:        Behavior: Behavior normal.        Thought Content: Thought content normal.     Comments: anxious     LABORATORY DATA:  I have reviewed the data as listed Lab Results   Component Value Date   WBC 7.3 01/12/2019   HGB 9.5 (L) 01/12/2019   HCT 29.7 (L) 01/12/2019   MCV 92.5 01/12/2019   PLT 133 (L) 01/12/2019   Recent Labs    08/05/18 0429  11/08/18 1326 11/11/18 0905 01/12/19 1134  NA 140   < > 139 136 139  K 3.5   < > 3.8 3.6 4.0  CL 108   < > 110 108 107  CO2 23   < > 22 21* 25  GLUCOSE 108*   < > 102* 96 103*  BUN 11   < >  '8 8 8  ' CREATININE 0.95   < > 0.85 0.97 0.94  CALCIUM 8.7*   < > 9.2 8.9 9.2  GFRNONAA >60   < > >60 >60 >60  GFRAA >60   < > >60 >60 >60  PROT 7.1  --   --  7.2 7.2  ALBUMIN 4.0  --   --  4.1 4.4  AST 20  --   --  17 16  ALT 18  --   --  18 20  ALKPHOS 44  --   --  47 45  BILITOT 0.6  --   --  1.1 1.0   < > = values in this interval not displayed.   Iron/TIBC/Ferritin/ %Sat    Component Value Date/Time   IRON 54 01/12/2019 1134   TIBC 325 01/12/2019 1134   FERRITIN 32 01/12/2019 1134   IRONPCTSAT 17 01/12/2019 1134      RADIOGRAPHIC STUDIES: I have personally reviewed the radiological images as listed and agreed with the findings in the report. No results found. 08/05/2018 CT chest angio PE protocol, no PE, no appreciable thoracic adenopathy. Focal hiatal hernia.    ASSESSMENT & PLAN:  1. Thrombocytopenia ([place])   2. Normocytic anemia    Labs are reviewed and discussed with patient.  I recommend checking CBC;CMP, LDH; smear review, folate, Vitamin B12, hepatitis, HIV, ANA,  flowcytometry and monoclonal gammopathy workup. Also, discussed with the patient that if no clear etiology found- bone marrow biopsy would be suggested. Currently await for the above workup.   Given recently use of bactrim, drug induced cytopenia can be a possibility.  # Patient follow-up with me in approximately 2 weeks to review the above results.  Orders Placed This Encounter  Procedures  . CBC with Differential/Platelet    Standing Status:   Future    Number of Occurrences:   1    Standing Expiration Date:   01/12/2020  .  Comprehensive metabolic panel    Standing Status:   Future    Number of Occurrences:   1    Standing Expiration Date:   01/12/2020  . Hepatitis panel, acute    Standing Status:   Future    Number of Occurrences:   1    Standing Expiration Date:   01/12/2020  . Technologist smear review    Standing Status:   Future    Number of Occurrences:   1    Standing Expiration Date:   01/12/2020  . Vitamin B12    Standing Status:   Future    Number of Occurrences:   1    Standing Expiration Date:   01/12/2020  . Folate    Standing Status:   Future    Number of Occurrences:   1    Standing Expiration Date:   01/12/2020  . Iron and TIBC    Standing Status:   Future    Number of Occurrences:   1    Standing Expiration Date:   01/12/2020  . Ferritin    Standing Status:   Future    Number of Occurrences:   1    Standing Expiration Date:   01/12/2020  . Lactate dehydrogenase    Standing Status:   Future    Number of Occurrences:   1    Standing Expiration Date:   01/12/2020  . ANA, IFA (with reflex)    Standing Status:   Future    Number of Occurrences:   1  Standing Expiration Date:   01/12/2020    All questions were answered. The patient knows to call the clinic with any problems questions or concerns.  cc Melynda Ripple, MD    Return of visit:  Thank you for this kind referral and the opportunity to participate in the care of this patient. A copy of today's note is routed to referring provider  Total face to face encounter time for this patient visit was 45 min. >50% of the time was  spent in counseling and coordination of care.    Earlie Server, MD, PhD Hematology Oncology [facility] at [facility] Pager- [phone] 01/13/2019

## 2019-01-17 ENCOUNTER — Other Ambulatory Visit: Payer: Self-pay

## 2019-01-17 ENCOUNTER — Telehealth: Payer: Self-pay

## 2019-01-17 ENCOUNTER — Emergency Department: Payer: Medicaid Other

## 2019-01-17 ENCOUNTER — Emergency Department
Admission: EM | Admit: 2019-01-17 | Discharge: 2019-01-17 | Disposition: A | Discharge status: Home / Self Care | Payer: Medicaid Other | Attending: Emergency Medicine | Admitting: Emergency Medicine

## 2019-01-17 DIAGNOSIS — R42 Dizziness and giddiness: Secondary | ICD-10-CM | POA: Diagnosis present

## 2019-01-17 DIAGNOSIS — Z79899 Other long term (current) drug therapy: Secondary | ICD-10-CM | POA: Diagnosis not present

## 2019-01-17 DIAGNOSIS — D696 Thrombocytopenia, unspecified: Secondary | ICD-10-CM | POA: Diagnosis not present

## 2019-01-17 DIAGNOSIS — J45909 Unspecified asthma, uncomplicated: Secondary | ICD-10-CM | POA: Insufficient documentation

## 2019-01-17 DIAGNOSIS — R55 Syncope and collapse: Secondary | ICD-10-CM | POA: Diagnosis not present

## 2019-01-17 LAB — URINALYSIS, COMPLETE (UACMP) WITH MICROSCOPIC
Bilirubin Urine: NEGATIVE
Glucose, UA: NEGATIVE mg/dL
Hgb urine dipstick: NEGATIVE
Ketones, ur: NEGATIVE mg/dL
Leukocytes,Ua: NEGATIVE
Nitrite: NEGATIVE
Protein, ur: NEGATIVE mg/dL
Specific Gravity, Urine: 1.005 (ref 1.005–1.030)
pH: 7 (ref 5.0–8.0)

## 2019-01-17 LAB — POCT PREGNANCY, URINE: Preg Test, Ur: NEGATIVE

## 2019-01-17 MED ORDER — PROCHLORPERAZINE EDISYLATE 10 MG/2ML IJ SOLN: 10.0000 mg | Freq: Once | INTRAMUSCULAR | Status: DC

## 2019-01-17 MED ORDER — LACTATED RINGERS IV BOLUS
1000.0000 mL | Freq: Once | INTRAVENOUS | Status: AC
  Administered 2019-01-17: 17:00:00 1000 mL via INTRAVENOUS

## 2019-01-17 MED ORDER — PROMETHAZINE HCL 25 MG/ML IJ SOLN: 25.0000 mg | Freq: Once | INTRAMUSCULAR | Status: DC

## 2019-01-17 MED ORDER — ACETAMINOPHEN 500 MG PO TABS
1000.0000 mg | ORAL_TABLET | Freq: Once | ORAL | Status: DC
  Filled 2019-01-17: qty 2

## 2019-01-17 MED ORDER — DIPHENHYDRAMINE HCL 50 MG/ML IJ SOLN: 25.0000 mg | Freq: Once | INTRAMUSCULAR | Status: DC

## 2019-01-17 MED ORDER — ACETAMINOPHEN 500 MG PO TABS
500.0000 mg | ORAL_TABLET | Freq: Once | ORAL | Status: AC
  Administered 2019-01-17: 17:00:00 500 mg via ORAL

## 2019-01-17 MED ORDER — ONDANSETRON 4 MG PO TBDP
4.0000 mg | ORAL_TABLET | Freq: Once | ORAL | Status: AC
  Administered 2019-01-17: 17:00:00 4 mg via ORAL
  Filled 2019-01-17: qty 1

## 2019-01-17 NOTE — ED Notes (Signed)
This RN called to bedside by patient who states that her IV is hurting, swelling and hardness noted with palpation. IVF stopped by this RN, 2 more attempts for IV by this RN, pt became upset repeatedly stating, "I want to go home, I want to go home", pt refusing to allow this RN to repeat bloodwork or establish another IV, pt's husband at bedside at this time. MD at bedside and spoke with patient regarding refusal of care, per MD administer PO meds and send urine. Pt remains alert and oriented. Will continue to monitor.

## 2019-01-17 NOTE — Progress Notes (Signed)
Schedule pool message sent for lab appt

## 2019-01-17 NOTE — Telephone Encounter (Signed)
LM informing patient that MD wants additional lab work and scheduling will be giving her a call with appt date and time.  Scheduling pool message sent.

## 2019-01-17 NOTE — Telephone Encounter (Signed)
-----   Message from Earlie Server, MD sent at 01/13/2019 10:12 PM EDT ----- Nira Conn, please arrange pt to get additional labs done. Follow up as planned.

## 2019-01-17 NOTE — ED Triage Notes (Signed)
Pt arrives via ems from home. Ems reports fiance stated pt was walking and all of a sudden became ridged and started shaking. Possible siezure like activity w/o hx. Pt a&o x 4 on arrival but appears very lethargic and having hard time keeping eyes open. MD at bedside to assess on arrival. Pt hx of low platelets, hemoglobin, and red blood cells. Seen oncologist last week, another apt scheduled this Friday. Pt c/o headache described as intense pressure. Pt denies complete loss of LOC but reports feeling "brain fog".   Ems vitals cbg 90 Temp 98.1 95% HR 106  BP 140/90

## 2019-01-17 NOTE — ED Notes (Signed)
Pt refused medications, states "I can only take Tylenol".

## 2019-01-17 NOTE — ED Provider Notes (Signed)
[facility] Emergency Department Provider Note   ____________________________________________   First MD Initiated Contact with Patient 01/17/19 1519     (approximate)  I have reviewed the triage vital signs and the nursing notes.   HISTORY  Chief Complaint Dizziness   HPI Elizabeth Butler is a 38 y.o. female with past medical history of thrombocytopenia who presents to the ED complaining of dizziness and lightheadedness.  Patient reports that she woke up feeling generally weak this morning and felt increasingly lightheaded just prior to arrival when she was with her husband.  She felt like she might pass out and states that she felt her body stiffened and he had to lowered her to the ground.  She states she never fully passed out, only felt like she had lost control of her body.  She denies hitting her head or any other trauma, but states she has had a headache since lunchtime, denies any history of headaches.  She denies any fevers, chills, chest pain, shortness of breath, abdominal pain, dysuria, or hematuria.  She states her LMP was last week.  She reports a history of similar episodes in the past, is unsure what is caused them.  EMS was called for possible seizure, but did not note any postictal activity.        Past Medical History:  Diagnosis Date  . Prediabetes   . Rhinitis     Patient Active Problem List   Diagnosis Date Noted  . Asthma 08/10/2018  . Hypoxia 08/05/2018    Past Surgical History:  Procedure Laterality Date  . TUBAL LIGATION      Prior to Admission medications   Medication Sig Start Date End Date Taking? Authorizing Provider  albuterol (PROVENTIL HFA;VENTOLIN HFA) 108 (90 Base) MCG/ACT inhaler Inhale 2 puffs into the lungs every 4 (four) hours as needed for wheezing or shortness of breath. 08/06/18   Adrian Saran, MD  azelastine (ASTELIN) 0.1 % nasal spray U 2 SPRAYS IEN BID 11/02/18   [provider]  fluticasone  (FLONASE) 50 MCG/ACT nasal spray SHAKE LQ AND U 2 SPRAYS IEN QD FOR 7 DAYS 10/31/18   [provider]  ipratropium (ATROVENT) 0.03 % nasal spray Place 2 sprays into both nostrils 3 (three) times daily. 09/04/18   Ofilia Neas, PA-C  Multiple Vitamins-Minerals (MULTIPLE VITAMINS/WOMENS) tablet Take 1 tablet by mouth daily.    [provider]  famotidine (PEPCID) 20 MG tablet Take 1 tablet (20 mg total) by mouth 2 (two) times daily. 11/04/18 01/08/19  End, Cristal Deer, MD  promethazine (PHENERGAN) 12.5 MG tablet Take 1 tablet (12.5 mg total) by mouth every 6 (six) hours as needed. 11/08/18 01/08/19  Willy Eddy, MD    Allergies Amoxicillin-pot clavulanate  Family History  Problem Relation Age of Onset  . Hypertension Mother   . Colon cancer Maternal Grandmother     Social History Social History   Tobacco Use  . Smoking status: Never Smoker  . Smokeless tobacco: Never Used  Substance Use Topics  . Alcohol use: Not Currently  . Drug use: Never    Review of Systems  Constitutional: No fever/chills Eyes: No visual changes. ENT: No sore throat. Cardiovascular: Denies chest pain.  Positive for dizziness, lightheadedness, and near syncope. Respiratory: Denies shortness of breath. Gastrointestinal: No abdominal pain.  Positive for nausea, no vomiting.  No diarrhea.  No constipation. Genitourinary: Negative for dysuria. Musculoskeletal: Negative for back pain. Skin: Negative for rash. Neurological: Positive for headache, negative for focal  weakness or numbness.  ____________________________________________   PHYSICAL EXAM:  VITAL SIGNS: ED Triage Vitals  Enc Vitals Group     BP      Pulse      Resp      Temp      Temp src      SpO2      Weight      Height      Head Circumference      Peak Flow      Pain Score      Pain Loc      Pain Edu?      Excl. in GC?     Constitutional: Alert and oriented. Eyes: Conjunctivae are normal. Head:  Atraumatic. Nose: No congestion/rhinnorhea. Mouth/Throat: Mucous membranes are moist. Neck: Normal ROM Cardiovascular: Normal rate, regular rhythm. Grossly normal heart sounds. Respiratory: Normal respiratory effort.  No retractions. Lungs CTAB. Gastrointestinal: Soft and nontender. No distention. Genitourinary: deferred Musculoskeletal: No lower extremity tenderness nor edema. Neurologic:  Normal speech and language. No gross focal neurologic deficits are appreciated. Skin:  Skin is warm, dry and intact. No rash noted. Psychiatric: Mood and affect are normal. Speech and behavior are normal.  ____________________________________________   LABS (all labs ordered are listed, but only abnormal results are displayed)  Labs Reviewed  URINALYSIS, COMPLETE (UACMP) WITH MICROSCOPIC - Abnormal; Notable for the following components:      Result Value   Color, Urine STRAW (*)    APPearance CLEAR (*)    Bacteria, UA RARE (*)    All other components within normal limits  CBC WITH DIFFERENTIAL/PLATELET  POC URINE PREG, ED  POCT PREGNANCY, URINE   ____________________________________________  EKG  ED ECG REPORT I, Chesley Noonharles Cartez Mogle, the attending physician, personally viewed and interpreted this ECG.   Date: 01/17/2019  EKG Time: 15:35  Rate: 84  Rhythm: normal sinus rhythm  Axis: Normal  Intervals:none  ST&T Change: None    PROCEDURES  Procedure(s) performed (including Critical Care):  Procedures   ____________________________________________   INITIAL IMPRESSION / ASSESSMENT AND PLAN / ED COURSE       38 year old female with history of thrombocytopenia presents to the ED after episode of lightheadedness and near syncope just prior to arrival, continues to feel lightheaded with headache and nausea.  She has a nonfocal neurologic exam, do not suspect seizure activity given she never lost consciousness and no postictal state noted.  EKG without arrhythmia genic focus or  ischemic changes, do not suspect ACS.  Will screen head CT given acute onset headache with near syncope, although [facility].  Do not suspect meningitis given lack of fever or meningismus.  Will screen labs, Upreg, UA, and chest x-ray.  Urine pregnancy negative and UA unremarkable.  CT head negative for acute process.  Chest x-ray negative for acute process.  Patient unfortunately had infiltration of her IV site and labs were unable to be obtained.  Patient declined further blood draw attempts, desired only p.o. medications for nausea and headache.  She reports feeling much better and is requesting to be discharged home.  Counseled patient to follow-up with PCP and return to the ED for new or worsening symptoms, patient agrees with plan.      ____________________________________________   FINAL CLINICAL IMPRESSION(S) / ED DIAGNOSES  Final diagnoses:  Near syncope  Lightheadedness     ED Discharge Orders    None       Note:  This document was prepared using Dragon voice recognition software  and may include unintentional dictation errors.   Blake Divine, MD 01/18/19 [phone]

## 2019-01-19 ENCOUNTER — Other Ambulatory Visit: Payer: Self-pay

## 2019-01-19 ENCOUNTER — Telehealth: Payer: Self-pay | Admitting: *Deleted

## 2019-01-19 NOTE — Telephone Encounter (Signed)
ED note reviewed. Patient should call PCP for a follow up appointment for her seizure activity.

## 2019-01-19 NOTE — Telephone Encounter (Signed)
Patient called requesting to see Dr Tasia Catchings tomorrow when she comes in for labs because she had a seizure yesterday. Please advise

## 2019-01-20 ENCOUNTER — Inpatient Hospital Stay: Payer: Medicaid Other

## 2019-01-20 ENCOUNTER — Other Ambulatory Visit: Payer: Self-pay

## 2019-01-20 DIAGNOSIS — D696 Thrombocytopenia, unspecified: Secondary | ICD-10-CM

## 2019-01-20 DIAGNOSIS — D649 Anemia, unspecified: Secondary | ICD-10-CM

## 2019-01-20 NOTE — Telephone Encounter (Signed)
Patient advised she needs to call her PCP, she stated" OK I will"

## 2019-01-22 LAB — KAPPA/LAMBDA LIGHT CHAINS
Kappa free light chain: 15.2 mg/L (ref 3.3–19.4)
Kappa, lambda light chain ratio: 1.27 (ref 0.26–1.65)
Lambda free light chains: 12 mg/L (ref 5.7–26.3)

## 2019-01-23 LAB — MULTIPLE MYELOMA PANEL, SERUM
Albumin SerPl Elph-Mcnc: 3.9 g/dL (ref 2.9–4.4)
Albumin/Glob SerPl: 1.4 (ref 0.7–1.7)
Alpha 1: 0.2 g/dL (ref 0.0–0.4)
Alpha2 Glob SerPl Elph-Mcnc: 0.7 g/dL (ref 0.4–1.0)
B-Globulin SerPl Elph-Mcnc: 0.8 g/dL (ref 0.7–1.3)
Gamma Glob SerPl Elph-Mcnc: 1.1 g/dL (ref 0.4–1.8)
Globulin, Total: 2.8 g/dL (ref 2.2–3.9)
IgA: 176 mg/dL (ref 87–352)
IgG (Immunoglobin G), Serum: 1126 mg/dL (ref 586–1602)
IgM (Immunoglobulin M), Srm: 129 mg/dL (ref 26–217)
Total Protein ELP: 6.7 g/dL (ref 6.0–8.5)

## 2019-01-24 ENCOUNTER — Encounter: Payer: Self-pay | Admitting: Oncology

## 2019-01-24 ENCOUNTER — Other Ambulatory Visit: Payer: Self-pay

## 2019-01-24 LAB — COMP PANEL: LEUKEMIA/LYMPHOMA

## 2019-01-24 NOTE — Progress Notes (Signed)
Patient pre screened for office visit no questions or concerns.

## 2019-01-25 ENCOUNTER — Inpatient Hospital Stay: Payer: Medicaid Other

## 2019-01-25 ENCOUNTER — Other Ambulatory Visit: Payer: Self-pay

## 2019-01-25 ENCOUNTER — Inpatient Hospital Stay (HOSPITAL_BASED_OUTPATIENT_CLINIC_OR_DEPARTMENT_OTHER): Payer: Medicaid Other | Admitting: Oncology

## 2019-01-25 VITALS — BP 120/84 | HR 79 | Temp 98.1°F | Ht 63.0 in | Wt 205.0 lb

## 2019-01-25 DIAGNOSIS — D696 Thrombocytopenia, unspecified: Secondary | ICD-10-CM

## 2019-01-25 DIAGNOSIS — D649 Anemia, unspecified: Secondary | ICD-10-CM

## 2019-01-25 LAB — CBC WITH DIFFERENTIAL/PLATELET
Abs Immature Granulocytes: 0.01 10*3/uL (ref 0.00–0.07)
Basophils Absolute: 0 10*3/uL (ref 0.0–0.1)
Basophils Relative: 0 %
Eosinophils Absolute: 0 10*3/uL (ref 0.0–0.5)
Eosinophils Relative: 0 %
HCT: 32 % — ABNORMAL LOW (ref 36.0–46.0)
Hemoglobin: 10.1 g/dL — ABNORMAL LOW (ref 12.0–15.0)
Immature Granulocytes: 0 %
Lymphocytes Relative: 26 %
Lymphs Abs: 2 10*3/uL (ref 0.7–4.0)
MCH: 28.9 pg (ref 26.0–34.0)
MCHC: 31.6 g/dL (ref 30.0–36.0)
MCV: 91.7 fL (ref 80.0–100.0)
Monocytes Absolute: 0.4 10*3/uL (ref 0.1–1.0)
Monocytes Relative: 6 %
Neutro Abs: 5.2 10*3/uL (ref 1.7–7.7)
Neutrophils Relative %: 68 %
Platelets: 232 10*3/uL (ref 150–400)
RBC: 3.49 MIL/uL — ABNORMAL LOW (ref 3.87–5.11)
RDW: 15.8 % — ABNORMAL HIGH (ref 11.5–15.5)
WBC: 7.6 10*3/uL (ref 4.0–10.5)
nRBC: 0 % (ref 0.0–0.2)

## 2019-01-25 NOTE — Progress Notes (Signed)
Patient stated that she had been having nausea and headaches. Patient would like Zofran for her nausea.

## 2019-01-26 NOTE — Progress Notes (Signed)
Hematology/Oncology Consult note [facility] [phone] [phone]   Patient Care Team: [name], MD as PCP - General (Family Medicine)  REFERRING PROVIDER: Chase Picket, MD  CHIEF COMPLAINTS/REASON FOR VISIT:  Follow-up for anemia and thrombocytopenia  HISTORY OF PRESENTING ILLNESS:   Elizabeth Butler is a  38 y.o.  female with PMH listed below was seen in consultation at the request of  Sabapathi, Myriam Forehand, MD  for evaluation of thrombocytopenia She had recent blood work done, hemoglobin 9.5, MCV 90, wbc 8.6, platelet count 49,000. Normal differential.  She has normal hemoglobin count on 11/11/2018 with hemoglobin 12.3.  Patient is very anxious.  Denies any bleeding, fever chills, shortness of breath, denies any new medication or herbal supplements.  ED visit on 11/11/2018, due to feeling confused, memory problems, increased thirst, dizziness, frequent urination.  Negative blood work up. Few bacteria, was discharged with Bactrim for presumed UTI. Urine culture grew ,10,000 colonies.  ED Visit on 01/08/2019, easy bruising for 2 days. Also vaginal spot bleeding.   INTERVAL HISTORY Elizabeth Butler is a 38 y.o. female who has above history reviewed by me today presents for follow up visit for management of anemia and thrombocytopenia. Problems and complaints are listed below: Patient reports feeling tired.  She has some nausea. Had a ED visit on 01/17/2019 due to dizziness and lightheadedness.  She felt that she may has passed out and then she felt her body stiffened.  EMS was called for possible seizure but did not note any post ictal activity. CT head was negative for acute process.  X-ray negative for acute process.  UA unremarkable.  Urine pregnancy testing negative.  Patient felt better in the emergency room after taking p.o. medications for nausea and headache.  Patient was discharged from ED with advised to follow-up with PCP   Review of Systems  Constitutional: Positive for fatigue. Negative for appetite change, chills and fever.  HENT:   Negative for hearing loss and voice change.   Eyes: Negative for eye problems.  Respiratory: Negative for chest tightness and cough.   Cardiovascular: Negative for chest pain.  Gastrointestinal: Positive for nausea. Negative for abdominal distention, abdominal pain and blood in stool.  Endocrine: Negative for hot flashes.  Genitourinary: Negative for difficulty urinating and frequency.   Musculoskeletal: Negative for arthralgias.  Skin: Negative for itching and rash.  Neurological: Negative for extremity weakness.  Hematological: Negative for adenopathy. Does not bruise/bleed easily.  Psychiatric/Behavioral: Negative for confusion.    MEDICAL HISTORY:  Past Medical History:  Diagnosis Date  . Prediabetes   . Rhinitis     SURGICAL HISTORY: Past Surgical History:  Procedure Laterality Date  . TUBAL LIGATION      SOCIAL HISTORY: Social History   Socioeconomic History  . Marital status: Single    Spouse name: Not on file  . Number of children: Not on file  . Years of education: Not on file  . Highest education level: Not on file  Occupational History  . Not on file  Social Needs  . Financial resource strain: Not on file  . Food insecurity    Worry: Not on file    Inability: Not on file  . Transportation needs    Medical: Not on file    Non-medical: Not on file  Tobacco Use  . Smoking status: Never Smoker  . Smokeless tobacco: Never Used  Substance and Sexual Activity  . Alcohol use: Not Currently  . Drug use: Never  .  Sexual activity: Not on file  Lifestyle  . Physical activity    Days per week: Not on file    Minutes per session: Not on file  . Stress: Not on file  Relationships  . Social Herbalist on phone: Not on file    Gets together: Not on file    Attends religious service: Not on file    Active member of club or organization:  Not on file    Attends meetings of clubs or organizations: Not on file    Relationship status: Not on file  . Intimate partner violence    Fear of current or ex partner: Not on file    Emotionally abused: Not on file    Physically abused: Not on file    Forced sexual activity: Not on file  Other Topics Concern  . Not on file  Social History Narrative  . Not on file    FAMILY HISTORY: Family History  Problem Relation Age of Onset  . Hypertension Mother   . Colon cancer Maternal Grandmother     ALLERGIES:  is allergic to amoxicillin-pot clavulanate.  MEDICATIONS:  Current Outpatient Medications  Medication Sig Dispense Refill  . azelastine (ASTELIN) 0.1 % nasal spray U 2 SPRAYS IEN BID    . fluticasone (FLONASE) 50 MCG/ACT nasal spray SHAKE LQ AND U 2 SPRAYS IEN QD FOR 7 DAYS    . ipratropium (ATROVENT) 0.03 % nasal spray Place 2 sprays into both nostrils 3 (three) times daily. 30 mL 12  . Multiple Vitamins-Minerals (MULTIPLE VITAMINS/WOMENS) tablet Take 1 tablet by mouth daily.    Marland Kitchen albuterol (PROVENTIL HFA;VENTOLIN HFA) 108 (90 Base) MCG/ACT inhaler Inhale 2 puffs into the lungs every 4 (four) hours as needed for wheezing or shortness of breath. 1 Inhaler 0   No current facility-administered medications for this visit.      PHYSICAL EXAMINATION: ECOG PERFORMANCE STATUS: 1 - Symptomatic but completely ambulatory Vitals:   01/25/19 1025  BP: 120/84  Pulse: 79  Temp: 98.1 F (36.7 C)   Filed Weights   01/25/19 1025  Weight: 205 lb (93 kg)    Physical Exam Constitutional:      General: She is not in acute distress.    Appearance: She is obese.  HENT:     Head: Normocephalic and atraumatic.  Eyes:     General: No scleral icterus.    Pupils: Pupils are equal, round, and reactive to light.  Neck:     Musculoskeletal: Normal range of motion and neck supple.  Cardiovascular:     Rate and Rhythm: Normal rate and regular rhythm.     Heart sounds: Normal heart  sounds.  Pulmonary:     Effort: Pulmonary effort is normal. No respiratory distress.     Breath sounds: No wheezing.  Abdominal:     General: Bowel sounds are normal. There is no distension.     Palpations: Abdomen is soft. There is no mass.     Tenderness: There is no abdominal tenderness.  Musculoskeletal: Normal range of motion.        General: No deformity.  Skin:    General: Skin is warm and dry.     Findings: No erythema or rash.  Neurological:     Mental Status: She is alert and oriented to person, place, and time.     Cranial Nerves: No cranial nerve deficit.     Coordination: Coordination normal.  Psychiatric:  Behavior: Behavior normal.        Thought Content: Thought content normal.     Comments: anxious     LABORATORY DATA:  I have reviewed the data as listed Lab Results  Component Value Date   WBC 7.6 01/25/2019   HGB 10.1 (L) 01/25/2019   HCT 32.0 (L) 01/25/2019   MCV 91.7 01/25/2019   PLT 232 01/25/2019   Recent Labs    08/05/18 0429  11/08/18 1326 11/11/18 0905 01/12/19 1134  NA 140   < > 139 136 139  K 3.5   < > 3.8 3.6 4.0  CL 108   < > 110 108 107  CO2 23   < > 22 21* 25  GLUCOSE 108*   < > 102* 96 103*  BUN 11   < > _0 CREATININE 0.95   < > 0.85 0.97 0.94  CALCIUM 8.7*   < > 9.2 8.9 9.2  GFRNONAA >60   < > >60 >60 >60  GFRAA >60   < > >60 >60 >60  PROT 7.1  --   --  7.2 7.2  ALBUMIN 4.0  --   --  4.1 4.4  AST 20  --   --  17 16  ALT 18  --   --  18 20  ALKPHOS 44  --   --  47 45  BILITOT 0.6  --   --  1.1 1.0   < > = values in this interval not displayed.   Iron/TIBC/Ferritin/ %Sat    Component Value Date/Time   IRON 54 01/12/2019 1134   TIBC 325 01/12/2019 1134   FERRITIN 32 01/12/2019 1134   IRONPCTSAT 17 01/12/2019 1134      RADIOGRAPHIC STUDIES: I have personally reviewed the radiological images as listed and agreed with the findings in the report. Ct Head Wo Contrast  Result Date: 01/17/2019 CLINICAL DATA:   38 year old female with seizures. No trauma. EXAM: CT HEAD WITHOUT CONTRAST TECHNIQUE: Contiguous axial images were obtained from the base of the skull through the vertex without intravenous contrast. COMPARISON:  None. FINDINGS: Brain: No evidence of acute infarction, hemorrhage, hydrocephalus, extra-axial collection or mass lesion/mass effect. Vascular: No hyperdense vessel or unexpected calcification. Skull: Normal. Negative for fracture or focal lesion. Sinuses/Orbits: No acute finding. Other: None IMPRESSION: Normal noncontrast CT of the brain. Electronically Signed   By: Anner Crete M.D.   On: 01/17/2019 16:06   Dg Chest Portable 1 View  Result Date: 01/17/2019 CLINICAL DATA:  Shortness of breath EXAM: PORTABLE CHEST 1 VIEW COMPARISON:  11/08/2018 FINDINGS: The heart size and mediastinal contours are within normal limits. Both lungs are clear. The visualized skeletal structures are unremarkable. IMPRESSION: No acute cardiopulmonary findings. Electronically Signed   By: Davina Poke M.D.   On: 01/17/2019 16:00   08/05/2018 CT chest angio PE protocol, no PE, no appreciable thoracic adenopathy. Focal hiatal hernia.    ASSESSMENT & PLAN:  1. Thrombocytopenia ([place])   2. Normocytic anemia   Labs are reviewed and discussed with patient. She also has had blood work done on 01/20/2019.  Which showed complete resolution of thrombocytopenia with a platelet count of 282.  Hemoglobin also improved to 10.  01/12/2019 normal folate and B12.  Normal chemistry.  Negative hepatitis panel negative ANA Iron panel showed saturation 17, ferritin of 32.  01/20/2019 peripheral blood flow cytometry negative.  Multiple myeloma panel negative.  I discussed with patient that since her hemoglobin has slightly  improved and thrombocytopenia completely normalized, these findings are reassuring that the acute drop of platelet and hemoglobin may be reactive, or this can be a drug induced cytopenia secondary to recent use  of Bactrim. She may has had virus infection which caused transient drop of blood counts.   Recommend to repeat blood work today with CBC. If stable, patient can follow-up in 3 months. Today's labs reviewed.  CBC showed a stable hemoglobin at 10.  Normal platelet counts. Follow-up as planned.  Orders Placed This Encounter  Procedures  . CBC with Differential/Platelet    Standing Status:   Future    Number of Occurrences:   1    Standing Expiration Date:   01/25/2020  . CBC with Differential/Platelet    Standing Status:   Future    Standing Expiration Date:   01/25/2020    All questions were answered. The patient knows to call the clinic with any problems questions or concerns.  cc Chase Picket, MD    Return of visit: 3 months  Earlie Server, MD, PhD Hematology Oncology [facility] at [facility] Pager- [phone] 01/26/2019

## 2019-04-12 ENCOUNTER — Encounter: Payer: Self-pay | Admitting: Oncology

## 2019-04-12 DIAGNOSIS — D649 Anemia, unspecified: Secondary | ICD-10-CM

## 2019-04-12 DIAGNOSIS — D696 Thrombocytopenia, unspecified: Secondary | ICD-10-CM

## 2019-04-14 ENCOUNTER — Telehealth: Payer: Self-pay | Admitting: Oncology

## 2019-04-14 NOTE — Telephone Encounter (Signed)
Tried to call pt back from VM; phone said it was not available at this time, tried to call [id number]

## 2019-04-17 ENCOUNTER — Other Ambulatory Visit: Payer: Medicaid Other

## 2019-04-17 ENCOUNTER — Inpatient Hospital Stay: Payer: Medicaid Other

## 2019-04-21 ENCOUNTER — Ambulatory Visit: Payer: Medicaid Other | Admitting: Oncology

## 2019-04-24 ENCOUNTER — Other Ambulatory Visit: Payer: Self-pay

## 2019-04-24 ENCOUNTER — Inpatient Hospital Stay: Payer: Medicaid Other | Attending: Oncology

## 2019-04-24 DIAGNOSIS — Z800 Family history of malignant neoplasm of digestive organs: Secondary | ICD-10-CM | POA: Insufficient documentation

## 2019-04-24 DIAGNOSIS — N920 Excessive and frequent menstruation with regular cycle: Secondary | ICD-10-CM | POA: Insufficient documentation

## 2019-04-24 DIAGNOSIS — Z79899 Other long term (current) drug therapy: Secondary | ICD-10-CM | POA: Insufficient documentation

## 2019-04-24 DIAGNOSIS — D500 Iron deficiency anemia secondary to blood loss (chronic): Secondary | ICD-10-CM | POA: Diagnosis present

## 2019-04-24 DIAGNOSIS — Z8249 Family history of ischemic heart disease and other diseases of the circulatory system: Secondary | ICD-10-CM | POA: Diagnosis not present

## 2019-04-24 DIAGNOSIS — Z880 Allergy status to penicillin: Secondary | ICD-10-CM | POA: Diagnosis not present

## 2019-04-24 DIAGNOSIS — D696 Thrombocytopenia, unspecified: Secondary | ICD-10-CM

## 2019-04-24 DIAGNOSIS — D649 Anemia, unspecified: Secondary | ICD-10-CM

## 2019-04-24 LAB — CBC WITH DIFFERENTIAL/PLATELET
Abs Immature Granulocytes: 0.03 10*3/uL (ref 0.00–0.07)
Basophils Absolute: 0 10*3/uL (ref 0.0–0.1)
Basophils Relative: 1 %
Eosinophils Absolute: 0 10*3/uL (ref 0.0–0.5)
Eosinophils Relative: 0 %
HCT: 36.2 % (ref 36.0–46.0)
Hemoglobin: 11 g/dL — ABNORMAL LOW (ref 12.0–15.0)
Immature Granulocytes: 0 %
Lymphocytes Relative: 31 %
Lymphs Abs: 2.8 10*3/uL (ref 0.7–4.0)
MCH: 26.9 pg (ref 26.0–34.0)
MCHC: 30.4 g/dL (ref 30.0–36.0)
MCV: 88.5 fL (ref 80.0–100.0)
Monocytes Absolute: 0.5 10*3/uL (ref 0.1–1.0)
Monocytes Relative: 6 %
Neutro Abs: 5.5 10*3/uL (ref 1.7–7.7)
Neutrophils Relative %: 62 %
Platelets: 187 10*3/uL (ref 150–400)
RBC: 4.09 MIL/uL (ref 3.87–5.11)
RDW: 15.9 % — ABNORMAL HIGH (ref 11.5–15.5)
WBC: 8.8 10*3/uL (ref 4.0–10.5)
nRBC: 0 % (ref 0.0–0.2)

## 2019-04-24 LAB — IRON AND TIBC
Iron: 40 ug/dL (ref 28–170)
Saturation Ratios: 11 % (ref 10.4–31.8)
TIBC: 382 ug/dL (ref 250–450)
UIBC: 342 ug/dL

## 2019-04-24 LAB — TSH: TSH: 2.655 u[IU]/mL (ref 0.350–4.500)

## 2019-04-24 LAB — FERRITIN: Ferritin: 6 ng/mL — ABNORMAL LOW (ref 11–307)

## 2019-04-25 ENCOUNTER — Other Ambulatory Visit: Payer: Medicaid Other

## 2019-04-25 ENCOUNTER — Ambulatory Visit: Payer: Medicaid Other | Admitting: Oncology

## 2019-05-01 ENCOUNTER — Inpatient Hospital Stay: Payer: Medicaid Other | Admitting: Oncology

## 2019-05-08 ENCOUNTER — Encounter: Payer: Self-pay | Admitting: Oncology

## 2019-05-08 ENCOUNTER — Inpatient Hospital Stay (HOSPITAL_BASED_OUTPATIENT_CLINIC_OR_DEPARTMENT_OTHER): Payer: Medicaid Other | Admitting: Oncology

## 2019-05-08 DIAGNOSIS — D500 Iron deficiency anemia secondary to blood loss (chronic): Secondary | ICD-10-CM

## 2019-05-08 DIAGNOSIS — N920 Excessive and frequent menstruation with regular cycle: Secondary | ICD-10-CM

## 2019-05-08 MED ORDER — FERROUS SULFATE 325 (65 FE) MG PO TBEC: 325.0000 mg | DELAYED_RELEASE_TABLET | Freq: Two times a day (BID) | ORAL | 2 refills | Status: AC

## 2019-05-08 NOTE — Progress Notes (Signed)
HEMATOLOGY-ONCOLOGY TeleHEALTH VISIT PROGRESS NOTE  I connected with Elizabeth Butler on 05/08/19 at  1:30 PM EST by video enabled telemedicine visit and verified that I am speaking with the correct person using two identifiers. I discussed the limitations, risks, security and privacy concerns of performing an evaluation and management service by telemedicine and the availability of in-person appointments. I also discussed with the patient that there may be a patient responsible charge related to this service. The patient expressed understanding and agreed to proceed.   Other persons participating in the visit and their role in the encounter:  None  Patient's location: Home  Provider's location: office Chief Complaint: Anemia   INTERVAL HISTORY Elizabeth Butler is a 38 y.o. female who has above history reviewed by me today presents for follow up visit for management of anemia Problems and complaints are listed below:  Patient had blood work done during interval.  She has also self started on oral iron supplementation after reviewed her blood work. She reports feeling well at baseline.  No new complaints. She has heavy menstrual flow. Review of Systems  Constitutional: Negative for appetite change, chills, fatigue and fever.  HENT:   Negative for hearing loss and voice change.   Eyes: Negative for eye problems.  Respiratory: Negative for chest tightness and cough.   Cardiovascular: Negative for chest pain.  Gastrointestinal: Negative for abdominal distention, abdominal pain and blood in stool.  Endocrine: Negative for hot flashes.  Genitourinary: Negative for difficulty urinating and frequency.   Musculoskeletal: Negative for arthralgias.  Skin: Negative for itching and rash.  Neurological: Negative for extremity weakness.  Hematological: Negative for adenopathy.  Psychiatric/Behavioral: Negative for confusion.    Past Medical History:  Diagnosis Date  . Prediabetes   . Rhinitis    Past  Surgical History:  Procedure Laterality Date  . TUBAL LIGATION      Family History  Problem Relation Age of Onset  . Hypertension Mother   . Colon cancer Maternal Grandmother     Social History   Socioeconomic History  . Marital status: Single    Spouse name: Not on file  . Number of children: Not on file  . Years of education: Not on file  . Highest education level: Not on file  Occupational History  . Not on file  Tobacco Use  . Smoking status: Never Smoker  . Smokeless tobacco: Never Used  Substance and Sexual Activity  . Alcohol use: Not Currently  . Drug use: Never  . Sexual activity: Not on file  Other Topics Concern  . Not on file  Social History Narrative  . Not on file   Social Determinants of Health   Financial Resource Strain:   . Difficulty of Paying Living Expenses: Not on file  Food Insecurity:   . Worried About Charity fundraiser in the Last Year: Not on file  . Ran Out of Food in the Last Year: Not on file  Transportation Needs:   . Lack of Transportation (Medical): Not on file  . Lack of Transportation (Non-Medical): Not on file  Physical Activity:   . Days of Exercise per Week: Not on file  . Minutes of Exercise per Session: Not on file  Stress:   . Feeling of Stress : Not on file  Social Connections:   . Frequency of Communication with Friends and Family: Not on file  . Frequency of Social Gatherings with Friends and Family: Not on file  . Attends Religious Services: Not on  file  . Active Member of Clubs or Organizations: Not on file  . Attends BankerClub or Organization Meetings: Not on file  . Marital Status: Not on file  Intimate Partner Violence:   . Fear of Current or Ex-Partner: Not on file  . Emotionally Abused: Not on file  . Physically Abused: Not on file  . Sexually Abused: Not on file    Current Outpatient Medications on File Prior to Visit  Medication Sig Dispense Refill  . azelastine (ASTELIN) 0.1 % nasal spray U 2 SPRAYS IEN BID     . fluticasone (FLONASE) 50 MCG/ACT nasal spray SHAKE LQ AND U 2 SPRAYS IEN QD FOR 7 DAYS    . ipratropium (ATROVENT) 0.03 % nasal spray Place 2 sprays into both nostrils 3 (three) times daily. 30 mL 12  . Multiple Vitamins-Minerals (MULTIPLE VITAMINS/WOMENS) tablet Take 1 tablet by mouth daily.    Marland Kitchen. albuterol (PROVENTIL HFA;VENTOLIN HFA) 108 (90 Base) MCG/ACT inhaler Inhale 2 puffs into the lungs every 4 (four) hours as needed for wheezing or shortness of breath. (Patient not taking: Reported on 05/08/2019) 1 Inhaler 0  . [DISCONTINUED] famotidine (PEPCID) 20 MG tablet Take 1 tablet (20 mg total) by mouth 2 (two) times daily. 60 tablet 0  . [DISCONTINUED] promethazine (PHENERGAN) 12.5 MG tablet Take 1 tablet (12.5 mg total) by mouth every 6 (six) hours as needed. 12 tablet 0   No current facility-administered medications on file prior to visit.    Allergies  Allergen Reactions  . Amoxicillin-Pot Clavulanate Other (See Comments)    Palpitations, sweats, abdominal cramping, soft stools, Anxiety       Observations/Objective: Today's Vitals   05/08/19 1241  PainSc: 0-No pain   There is no height or weight on file to calculate BMI.  Physical Exam  Constitutional: She is oriented to person, place, and time. No distress.  Neurological: She is alert and oriented to person, place, and time.  Psychiatric: Mood normal.    CBC    Component Value Date/Time   WBC 8.8 04/24/2019 0852   RBC 4.09 04/24/2019 0852   HGB 11.0 (L) 04/24/2019 0852   HCT 36.2 04/24/2019 0852   PLT 187 04/24/2019 0852   MCV 88.5 04/24/2019 0852   MCH 26.9 04/24/2019 0852   MCHC 30.4 04/24/2019 0852   RDW 15.9 (H) 04/24/2019 0852   LYMPHSABS 2.8 04/24/2019 0852   MONOABS 0.5 04/24/2019 0852   EOSABS 0.0 04/24/2019 0852   BASOSABS 0.0 04/24/2019 0852    CMP     Component Value Date/Time   NA 139 01/12/2019 1134   K 4.0 01/12/2019 1134   CL 107 01/12/2019 1134   CO2 25 01/12/2019 1134   GLUCOSE 103 (H)  01/12/2019 1134   BUN 8 01/12/2019 1134   CREATININE 0.94 01/12/2019 1134   CALCIUM 9.2 01/12/2019 1134   PROT 7.2 01/12/2019 1134   ALBUMIN 4.4 01/12/2019 1134   AST 16 01/12/2019 1134   ALT 20 01/12/2019 1134   ALKPHOS 45 01/12/2019 1134   BILITOT 1.0 01/12/2019 1134   GFRNONAA >60 01/12/2019 1134   GFRAA >60 01/12/2019 1134     Assessment and Plan: 1. Iron deficiency anemia due to chronic blood loss   2. Menorrhagia with regular cycle     Labs are reviewed and discussed with the patient. Number cytopenia has resolved. Patient has anemia with hemoglobin 11.  Iron panel shows ferritin of 6, iron saturation 11 indicating iron deficiency anemia. Patient has heavy menstrual flow which  is most likely the cause of iron deficiency. Recommend patient establish care with GYN for further discussion of management of menorrhagia. For iron deficient anemia, recommend patient to take oral ferrous sulfate 325 mg twice daily with meals.   Repeat blood work in 3 months for reevaluation of the need of IV iron  Follow Up Instructions: 3 months   I discussed the assessment and treatment plan with the patient. The patient was provided an opportunity to ask questions and all were answered. The patient agreed with the plan and demonstrated an understanding of the instructions.  The patient was advised to call back or seek an in-person evaluation if the symptoms worsen or if the condition fails to improve as anticipated.    Rickard Patience, MD 05/08/2019 6:52 PM

## 2019-05-08 NOTE — Progress Notes (Signed)
Patient verified using two identifiers for virtual visit via telephone today.  Patient does not offer any problems today.  

## 2019-06-15 ENCOUNTER — Telehealth: Payer: Self-pay | Admitting: *Deleted

## 2019-06-15 NOTE — Telephone Encounter (Signed)
Schedule message sent. 

## 2019-06-15 NOTE — Telephone Encounter (Addendum)
Per Dr. Cathie Hoops, She can be scheduled for lab day 1 with MD virtual day 2 this week if possible or early next week.

## 2019-06-15 NOTE — Telephone Encounter (Signed)
When scheduling called to get her appts she could not come in for labs due to be out of town for the next 6-8 weeks.  Called patient to let her know Dr. Cathie Hoops recommendation to stop the oral iron pill that could be the cause for the nausea/vomiting but she will need to seek medical advice at a local facility for the other symptoms.  Patient reported that she is no longer taking the iron pill and has all the symptoms she previously reported.  I advised her it would be best for her to go to a local facility to her for evaluation, she said OK.

## 2019-06-15 NOTE — Telephone Encounter (Addendum)
Patient called requestoing a virtual appointment with Dr u and was transferred to Triage line. She states reqson for wanting an appointment is that she is having nausea and vomiting, joint pain and mid low back muscular pain and that she thinks it may be form the oral iron she is taking.She was recently checked for COVID due to these symptoms and it came back negative. The symptoms started a few days ago. Please advise She has no follow up appts scheduled but your last note mentioned follow up in 3 months.

## 2019-07-31 ENCOUNTER — Inpatient Hospital Stay: Payer: Medicaid Other | Attending: Oncology

## 2019-08-01 ENCOUNTER — Telehealth: Payer: Self-pay | Admitting: *Deleted

## 2019-08-01 ENCOUNTER — Inpatient Hospital Stay: Payer: Medicaid Other | Admitting: Oncology

## 2019-08-01 NOTE — Telephone Encounter (Signed)
Pt 08/01/19 MyChart visit was cancelled due to her not coming in to have her labs drawn on 07/31/19 Both lab and Mychart visits were R/S. A detailed message was left on pt vmail making her aware of both appts being R/s and to contact the office if appts dates needed to be r/s again

## 2019-08-07 ENCOUNTER — Inpatient Hospital Stay: Payer: Medicaid Other

## 2019-08-09 ENCOUNTER — Inpatient Hospital Stay: Payer: Medicaid Other | Admitting: Oncology

## 2019-08-21 ENCOUNTER — Inpatient Hospital Stay: Payer: Medicaid Other

## 2019-08-22 ENCOUNTER — Inpatient Hospital Stay: Payer: Medicaid Other | Admitting: Oncology

## 2020-12-08 IMAGING — DX CHEST  1 VIEW
1 series · 1 of 1 positions shown · non-contrast
Comparison: None.

CLINICAL DATA: Difficulty breathing for 2 weeks

EXAM:
CHEST  1 VIEW

[chest ap]
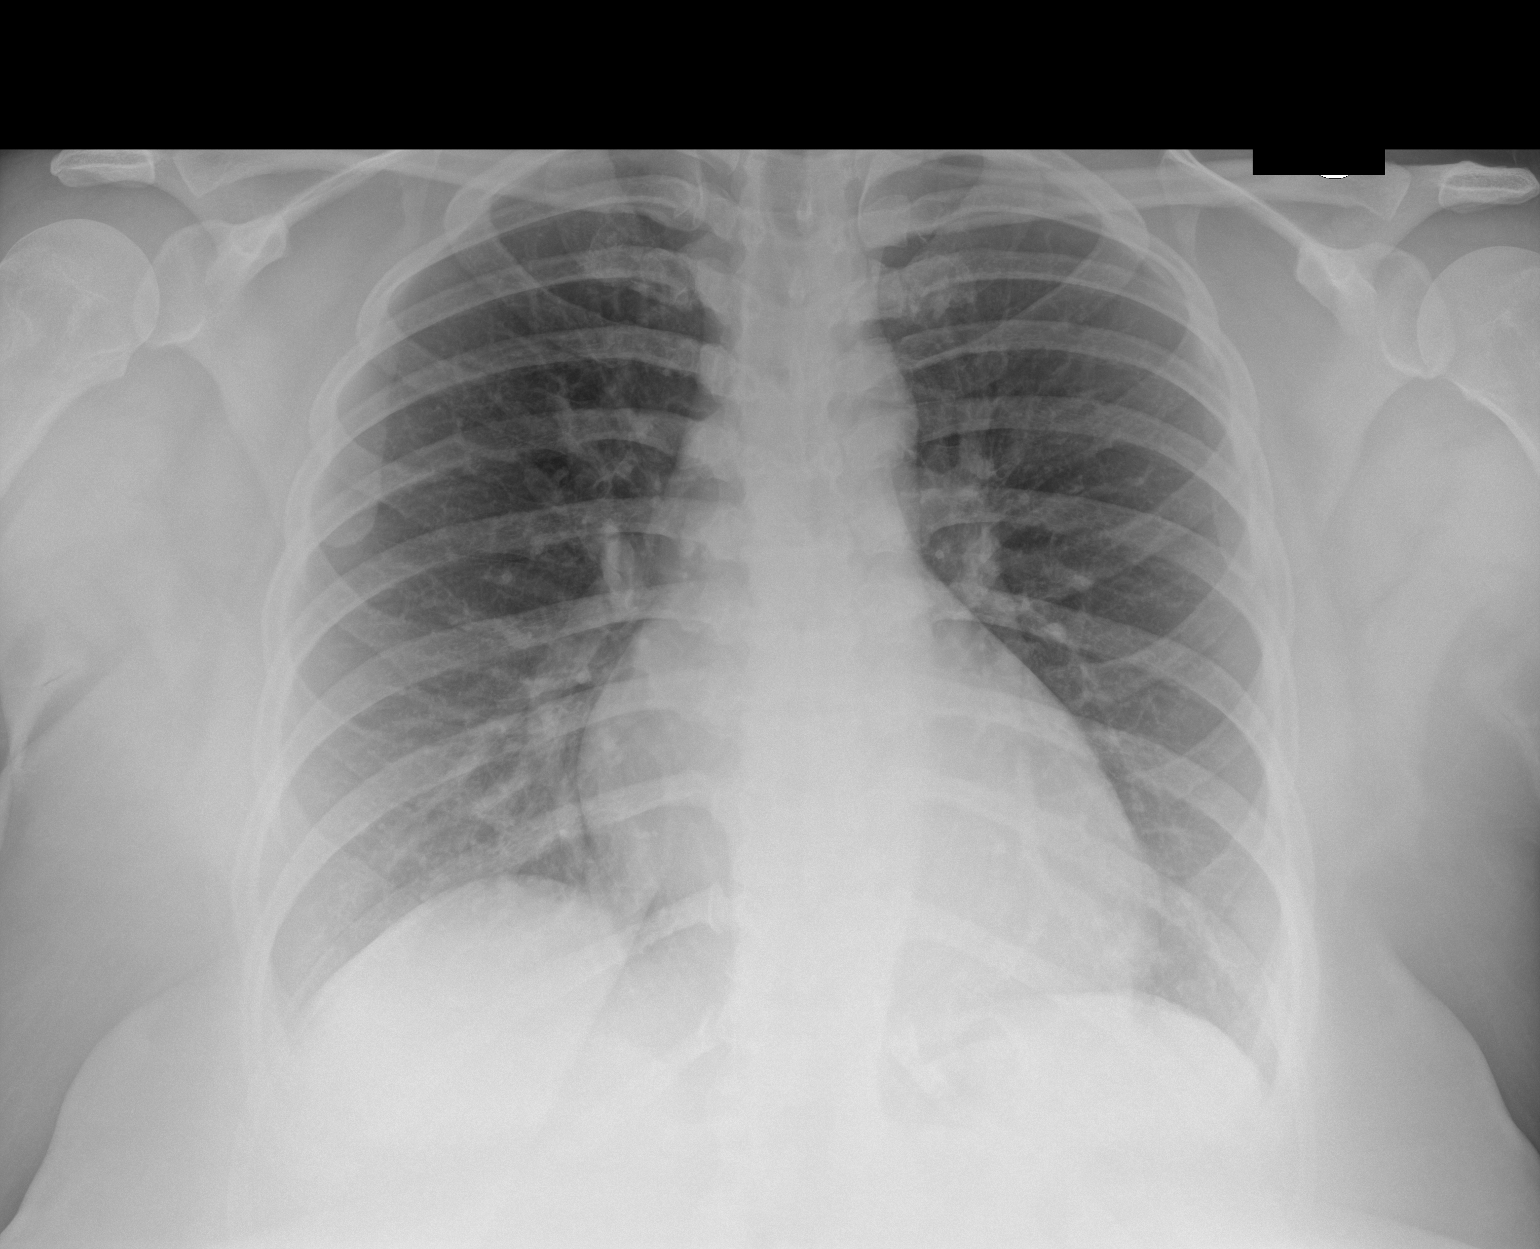

[1 of 1 positions shown; findings below may reference images not displayed]

FINDINGS: Normal heart size and mediastinal contours. No acute infiltrate or
edema. No effusion or pneumothorax. No acute osseous findings.
IMPRESSION: Negative portable chest.

## 2021-01-16 IMAGING — NM NUCLEAR MEDICINE HEPATOBILIARY IMAGING WITH GALLBLADDER EF
2 series · 12 of 12 positions shown · non-contrast
Comparison: None.

CLINICAL DATA: Epigastric pain and nausea.

EXAM:
NUCLEAR MEDICINE HEPATOBILIARY IMAGING WITH GALLBLADDER EF
TECHNIQUE: Sequential images of the abdomen were obtained [DATE] minutes
following intravenous administration of radiopharmaceutical. After
oral ingestion of Ensure, gallbladder ejection fraction was
determined. At 60 min, normal ejection fraction is greater than 33%.
RADIOPHARMACEUTICALS:  5.0 mCi Oc-EEm  Choletec IV

[Series 1000: hepatobiliary scan · 9.59mm/px · 6 of 60 frames shown]
[frame 6/60]
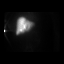
[frame 16/60]
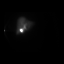
[frame 26/60]
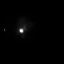
[frame 36/60]
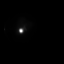
[frame 46/60]
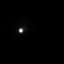
[frame 56/60]
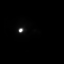

[Series 1000: gallbladder ef · 4.80mm/px · 6 of 120 frames shown]
[frame 11/120]
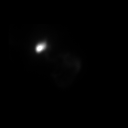
[frame 31/120]
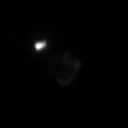
[frame 51/120]
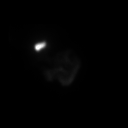
[frame 71/120]
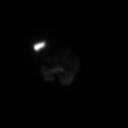
[frame 91/120]
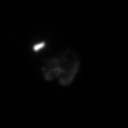
[frame 111/120]
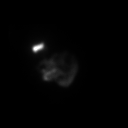

[12 of 12 positions shown; findings below may reference images not displayed]

FINDINGS: Prompt uptake and biliary excretion of activity by the liver is
seen. Gallbladder activity is visualized, consistent with patency of
cystic duct. Biliary activity passes into small bowel, consistent
with patent common bile duct.

Calculated gallbladder ejection fraction is 43%. (Normal gallbladder
ejection fraction with Ensure is greater than 33%.)
IMPRESSION: Normal hepatobiliary scan.

Normal gallbladder ejection fraction.
